# Patient Record
Sex: Female | Born: 1975 | Race: White | Hispanic: No | Marital: Married | Smoking: Current every day smoker
Health system: Southern US, Community
[De-identification: ages and names within clinical notes are randomized; demographics above are authoritative.]

## PROBLEM LIST (undated history)

## (undated) DIAGNOSIS — I8511 Secondary esophageal varices with bleeding: Secondary | ICD-10-CM

## (undated) DIAGNOSIS — D696 Thrombocytopenia, unspecified: Secondary | ICD-10-CM

## (undated) DIAGNOSIS — K7030 Alcoholic cirrhosis of liver without ascites: Secondary | ICD-10-CM

## (undated) DIAGNOSIS — I10 Essential (primary) hypertension: Secondary | ICD-10-CM

## (undated) DIAGNOSIS — F1020 Alcohol dependence, uncomplicated: Secondary | ICD-10-CM

## (undated) HISTORY — PX: OOPHORECTOMY: SHX86

## (undated) HISTORY — PX: OTHER SURGICAL HISTORY: SHX169

## (undated) HISTORY — PX: APPENDECTOMY: SHX54

---

## 2016-12-10 ENCOUNTER — Inpatient Hospital Stay (HOSPITAL_COMMUNITY): Payer: 59

## 2016-12-10 ENCOUNTER — Inpatient Hospital Stay (HOSPITAL_COMMUNITY): Payer: 59 | Admitting: Certified Registered Nurse Anesthetist

## 2016-12-10 ENCOUNTER — Encounter (HOSPITAL_COMMUNITY): Payer: Self-pay | Admitting: *Deleted

## 2016-12-10 ENCOUNTER — Inpatient Hospital Stay (HOSPITAL_COMMUNITY)
Admission: EM | Admit: 2016-12-10 | Discharge: 2016-12-14 | DRG: 432 | Disposition: A | Discharge status: Home / Self Care | Payer: 59 | Attending: Family Medicine | Admitting: Family Medicine

## 2016-12-10 ENCOUNTER — Encounter (HOSPITAL_COMMUNITY)
Admission: EM | Disposition: A | Discharge status: Home / Self Care | Payer: Self-pay | Source: Home / Self Care | Attending: Internal Medicine

## 2016-12-10 DIAGNOSIS — Z881 Allergy status to other antibiotic agents status: Secondary | ICD-10-CM | POA: Diagnosis not present

## 2016-12-10 DIAGNOSIS — D696 Thrombocytopenia, unspecified: Secondary | ICD-10-CM | POA: Insufficient documentation

## 2016-12-10 DIAGNOSIS — I8501 Esophageal varices with bleeding: Secondary | ICD-10-CM

## 2016-12-10 DIAGNOSIS — E875 Hyperkalemia: Secondary | ICD-10-CM | POA: Diagnosis present

## 2016-12-10 DIAGNOSIS — K766 Portal hypertension: Secondary | ICD-10-CM | POA: Diagnosis present

## 2016-12-10 DIAGNOSIS — D62 Acute posthemorrhagic anemia: Secondary | ICD-10-CM | POA: Diagnosis present

## 2016-12-10 DIAGNOSIS — I8500 Esophageal varices without bleeding: Secondary | ICD-10-CM | POA: Diagnosis not present

## 2016-12-10 DIAGNOSIS — D6959 Other secondary thrombocytopenia: Secondary | ICD-10-CM | POA: Diagnosis present

## 2016-12-10 DIAGNOSIS — F1020 Alcohol dependence, uncomplicated: Secondary | ICD-10-CM | POA: Diagnosis present

## 2016-12-10 DIAGNOSIS — K7030 Alcoholic cirrhosis of liver without ascites: Secondary | ICD-10-CM | POA: Diagnosis present

## 2016-12-10 DIAGNOSIS — K7460 Unspecified cirrhosis of liver: Secondary | ICD-10-CM

## 2016-12-10 DIAGNOSIS — D61818 Other pancytopenia: Secondary | ICD-10-CM | POA: Diagnosis present

## 2016-12-10 DIAGNOSIS — Z91013 Allergy to seafood: Secondary | ICD-10-CM | POA: Diagnosis not present

## 2016-12-10 DIAGNOSIS — I8511 Secondary esophageal varices with bleeding: Secondary | ICD-10-CM | POA: Diagnosis present

## 2016-12-10 DIAGNOSIS — K921 Melena: Secondary | ICD-10-CM | POA: Diagnosis present

## 2016-12-10 DIAGNOSIS — R109 Unspecified abdominal pain: Secondary | ICD-10-CM

## 2016-12-10 DIAGNOSIS — E860 Dehydration: Secondary | ICD-10-CM | POA: Diagnosis not present

## 2016-12-10 DIAGNOSIS — I959 Hypotension, unspecified: Secondary | ICD-10-CM | POA: Diagnosis present

## 2016-12-10 DIAGNOSIS — F17210 Nicotine dependence, cigarettes, uncomplicated: Secondary | ICD-10-CM | POA: Diagnosis present

## 2016-12-10 DIAGNOSIS — I10 Essential (primary) hypertension: Secondary | ICD-10-CM | POA: Diagnosis present

## 2016-12-10 DIAGNOSIS — K922 Gastrointestinal hemorrhage, unspecified: Secondary | ICD-10-CM | POA: Diagnosis not present

## 2016-12-10 HISTORY — DX: Essential (primary) hypertension: I10

## 2016-12-10 HISTORY — DX: Thrombocytopenia, unspecified: D69.6

## 2016-12-10 HISTORY — PX: ESOPHAGOGASTRODUODENOSCOPY (EGD) WITH PROPOFOL: SHX5813

## 2016-12-10 HISTORY — DX: Alcoholic cirrhosis of liver without ascites: K70.30

## 2016-12-10 HISTORY — DX: Alcohol dependence, uncomplicated: F10.20

## 2016-12-10 LAB — CBC WITH DIFFERENTIAL/PLATELET
BASOS ABS: 0.1 10*3/uL (ref 0.0–0.1)
Basophils Relative: 1 %
EOS PCT: 2 %
Eosinophils Absolute: 0.3 10*3/uL (ref 0.0–0.7)
HCT: 28.9 % — ABNORMAL LOW (ref 36.0–46.0)
HEMOGLOBIN: 9.6 g/dL — AB (ref 12.0–15.0)
LYMPHS ABS: 1.3 10*3/uL (ref 0.7–4.0)
LYMPHS PCT: 11 %
MCH: 35.3 pg — ABNORMAL HIGH (ref 26.0–34.0)
MCHC: 33.2 g/dL (ref 30.0–36.0)
MCV: 106.3 fL — AB (ref 78.0–100.0)
Monocytes Absolute: 1.3 10*3/uL — ABNORMAL HIGH (ref 0.1–1.0)
Monocytes Relative: 11 %
NEUTROS ABS: 8.8 10*3/uL — AB (ref 1.7–7.7)
NEUTROS PCT: 75 %
PLATELETS: 77 10*3/uL — AB (ref 150–400)
RBC: 2.72 MIL/uL — ABNORMAL LOW (ref 3.87–5.11)
RDW: 15.2 % (ref 11.5–15.5)
WBC: 11.7 10*3/uL — AB (ref 4.0–10.5)

## 2016-12-10 LAB — BASIC METABOLIC PANEL
ANION GAP: 3 — AB (ref 5–15)
BUN: 26 mg/dL — ABNORMAL HIGH (ref 6–20)
CO2: 24 mmol/L (ref 22–32)
Calcium: 8 mg/dL — ABNORMAL LOW (ref 8.9–10.3)
Chloride: 115 mmol/L — ABNORMAL HIGH (ref 101–111)
Creatinine, Ser: 0.62 mg/dL (ref 0.44–1.00)
Glucose, Bld: 133 mg/dL — ABNORMAL HIGH (ref 65–99)
Potassium: 5.1 mmol/L (ref 3.5–5.1)
Sodium: 142 mmol/L (ref 135–145)

## 2016-12-10 LAB — I-STAT CHEM 8, ED
BUN: 26 mg/dL — ABNORMAL HIGH (ref 6–20)
CHLORIDE: 110 mmol/L (ref 101–111)
Calcium, Ion: 1.2 mmol/L (ref 1.15–1.40)
Creatinine, Ser: 0.7 mg/dL (ref 0.44–1.00)
GLUCOSE: 136 mg/dL — AB (ref 65–99)
HCT: 28 % — ABNORMAL LOW (ref 36.0–46.0)
HEMOGLOBIN: 9.5 g/dL — AB (ref 12.0–15.0)
Potassium: 4.7 mmol/L (ref 3.5–5.1)
SODIUM: 140 mmol/L (ref 135–145)
TCO2: 25 mmol/L (ref 0–100)

## 2016-12-10 LAB — PROTIME-INR
INR: 1.37
PROTHROMBIN TIME: 16.9 s — AB (ref 11.4–15.2)

## 2016-12-10 LAB — HEMOGLOBIN AND HEMATOCRIT, BLOOD
HCT: 25.4 % — ABNORMAL LOW (ref 36.0–46.0)
HCT: 24 % — ABNORMAL LOW (ref 36.0–46.0)
HCT: 24.1 % — ABNORMAL LOW (ref 36.0–46.0)
HEMATOCRIT: 25.1 % — AB (ref 36.0–46.0)
HEMOGLOBIN: 8.4 g/dL — AB (ref 12.0–15.0)
HEMOGLOBIN: 8.2 g/dL — AB (ref 12.0–15.0)
Hemoglobin: 7.9 g/dL — ABNORMAL LOW (ref 12.0–15.0)
Hemoglobin: 7.9 g/dL — ABNORMAL LOW (ref 12.0–15.0)

## 2016-12-10 LAB — COMPREHENSIVE METABOLIC PANEL
ALK PHOS: 75 U/L (ref 38–126)
ALT: 61 U/L — AB (ref 14–54)
AST: 142 U/L — AB (ref 15–41)
Albumin: 2.9 g/dL — ABNORMAL LOW (ref 3.5–5.0)
Anion gap: 6 (ref 5–15)
BUN: 21 mg/dL — AB (ref 6–20)
CALCIUM: 8.6 mg/dL — AB (ref 8.9–10.3)
CHLORIDE: 109 mmol/L (ref 101–111)
CO2: 22 mmol/L (ref 22–32)
CREATININE: 0.75 mg/dL (ref 0.44–1.00)
GFR calc Af Amer: >60 mL/min (ref >60)
Glucose, Bld: 135 mg/dL — ABNORMAL HIGH (ref 65–99)
Potassium: 5.7 mmol/L — ABNORMAL HIGH (ref 3.5–5.1)
Sodium: 137 mmol/L (ref 135–145)
Total Bilirubin: 2.2 mg/dL — ABNORMAL HIGH (ref 0.3–1.2)
Total Protein: 6.6 g/dL (ref 6.5–8.1)

## 2016-12-10 LAB — TYPE AND SCREEN
ABO/RH(D): O POS
Antibody Screen: NEGATIVE

## 2016-12-10 LAB — I-STAT BETA HCG BLOOD, ED (MC, WL, AP ONLY)

## 2016-12-10 LAB — I-STAT CG4 LACTIC ACID, ED: LACTIC ACID, VENOUS: 0.62 mmol/L (ref 0.5–1.9)

## 2016-12-10 LAB — HCG, QUANTITATIVE, PREGNANCY: hCG, Beta Chain, Quant, S: <1 m[IU]/mL (ref <5)

## 2016-12-10 LAB — ABO/RH: ABO/RH(D): O POS

## 2016-12-10 LAB — TSH: TSH: 1.643 u[IU]/mL (ref 0.350–4.500)

## 2016-12-10 LAB — POC OCCULT BLOOD, ED: Fecal Occult Bld: POSITIVE — AB

## 2016-12-10 LAB — POTASSIUM: Potassium: 4.6 mmol/L (ref 3.5–5.1)

## 2016-12-10 LAB — MRSA PCR SCREENING: MRSA by PCR: NEGATIVE

## 2016-12-10 SURGERY — ESOPHAGOGASTRODUODENOSCOPY (EGD) WITH PROPOFOL
Anesthesia: Monitor Anesthesia Care

## 2016-12-10 MED ORDER — DEXAMETHASONE SODIUM PHOSPHATE 10 MG/ML IJ SOLN
INTRAMUSCULAR | Status: AC
  Filled 2016-12-10: qty 1

## 2016-12-10 MED ORDER — SODIUM POLYSTYRENE SULFONATE 15 GM/60ML PO SUSP
15.0000 g | Freq: Once | ORAL | Status: DC
  Filled 2016-12-10 (×2): qty 60

## 2016-12-10 MED ORDER — CHLORDIAZEPOXIDE HCL 5 MG PO CAPS
5.0000 mg | ORAL_CAPSULE | Freq: Three times a day (TID) | ORAL | Status: DC
  Administered 2016-12-10 – 2016-12-11 (×3): 5 mg via ORAL
  Filled 2016-12-10 (×3): qty 1

## 2016-12-10 MED ORDER — PROPOFOL 10 MG/ML IV BOLUS
INTRAVENOUS | Status: AC
  Filled 2016-12-10: qty 40

## 2016-12-10 MED ORDER — DEXAMETHASONE SODIUM PHOSPHATE 10 MG/ML IJ SOLN
INTRAMUSCULAR | Status: DC | PRN
  Administered 2016-12-10: 10 mg via INTRAVENOUS

## 2016-12-10 MED ORDER — CITALOPRAM HYDROBROMIDE 10 MG PO TABS: 10.0000 mg | ORAL_TABLET | Freq: Every day | ORAL | Status: DC

## 2016-12-10 MED ORDER — SODIUM CHLORIDE 0.9 % IV SOLN
8.0000 mg/h | INTRAVENOUS | Status: DC
  Administered 2016-12-10 – 2016-12-12 (×5): 8 mg/h via INTRAVENOUS
  Filled 2016-12-10 (×11): qty 80

## 2016-12-10 MED ORDER — OCTREOTIDE LOAD VIA INFUSION
50.0000 ug | Freq: Once | INTRAVENOUS | Status: AC
  Administered 2016-12-10: 50 ug via INTRAVENOUS
  Filled 2016-12-10: qty 25

## 2016-12-10 MED ORDER — FENTANYL CITRATE (PF) 100 MCG/2ML IJ SOLN
INTRAMUSCULAR | Status: DC | PRN
  Administered 2016-12-10: 100 ug via INTRAVENOUS

## 2016-12-10 MED ORDER — FOLIC ACID 1 MG PO TABS
1.0000 mg | ORAL_TABLET | Freq: Every day | ORAL | Status: DC
  Administered 2016-12-12 – 2016-12-14 (×3): 1 mg via ORAL
  Filled 2016-12-10 (×3): qty 1

## 2016-12-10 MED ORDER — SODIUM CHLORIDE 0.9 % IV SOLN
INTRAVENOUS | Status: DC | PRN
  Administered 2016-12-10: 13:00:00 via INTRAVENOUS

## 2016-12-10 MED ORDER — PROPOFOL 500 MG/50ML IV EMUL
INTRAVENOUS | Status: DC | PRN
  Administered 2016-12-10: 140 ug/kg/min via INTRAVENOUS

## 2016-12-10 MED ORDER — ADULT MULTIVITAMIN W/MINERALS CH
1.0000 | ORAL_TABLET | Freq: Every day | ORAL | Status: DC
  Administered 2016-12-12 – 2016-12-14 (×3): 1 via ORAL
  Filled 2016-12-10 (×3): qty 1

## 2016-12-10 MED ORDER — VITAMIN B-1 100 MG PO TABS
100.0000 mg | ORAL_TABLET | Freq: Every day | ORAL | Status: DC
  Administered 2016-12-12 – 2016-12-13 (×2): 100 mg via ORAL
  Filled 2016-12-10 (×3): qty 1

## 2016-12-10 MED ORDER — ALBUTEROL SULFATE (2.5 MG/3ML) 0.083% IN NEBU: 2.5000 mg | INHALATION_SOLUTION | RESPIRATORY_TRACT | Status: DC | PRN

## 2016-12-10 MED ORDER — LORAZEPAM 1 MG PO TABS
1.0000 mg | ORAL_TABLET | Freq: Four times a day (QID) | ORAL | Status: AC | PRN
  Administered 2016-12-12: 1 mg via ORAL
  Filled 2016-12-10: qty 1

## 2016-12-10 MED ORDER — SUCCINYLCHOLINE CHLORIDE 200 MG/10ML IV SOSY
PREFILLED_SYRINGE | INTRAVENOUS | Status: DC | PRN
  Administered 2016-12-10: 140 mg via INTRAVENOUS

## 2016-12-10 MED ORDER — PHENYLEPHRINE 40 MCG/ML (10ML) SYRINGE FOR IV PUSH (FOR BLOOD PRESSURE SUPPORT)
PREFILLED_SYRINGE | INTRAVENOUS | Status: DC | PRN
  Administered 2016-12-10: 40 ug via INTRAVENOUS

## 2016-12-10 MED ORDER — PROPOFOL 10 MG/ML IV BOLUS
INTRAVENOUS | Status: DC | PRN
  Administered 2016-12-10: 20 mg via INTRAVENOUS
  Administered 2016-12-10: 100 mg via INTRAVENOUS
  Administered 2016-12-10: 50 mg via INTRAVENOUS
  Administered 2016-12-10 (×2): 20 mg via INTRAVENOUS

## 2016-12-10 MED ORDER — FENTANYL CITRATE (PF) 100 MCG/2ML IJ SOLN
INTRAMUSCULAR | Status: AC
  Filled 2016-12-10: qty 2

## 2016-12-10 MED ORDER — POLYETHYLENE GLYCOL 3350 17 G PO PACK: 17.0000 g | PACK | Freq: Every day | ORAL | Status: DC | PRN

## 2016-12-10 MED ORDER — LACTATED RINGERS IV SOLN
INTRAVENOUS | Status: DC
Start: 2016-12-10 — End: 2016-12-14
  Administered 2016-12-12: 10 mL via INTRAVENOUS

## 2016-12-10 MED ORDER — DOCUSATE SODIUM 100 MG PO CAPS
200.0000 mg | ORAL_CAPSULE | Freq: Two times a day (BID) | ORAL | Status: DC
  Administered 2016-12-10 – 2016-12-14 (×7): 200 mg via ORAL
  Filled 2016-12-10 (×7): qty 2

## 2016-12-10 MED ORDER — SUCCINYLCHOLINE CHLORIDE 200 MG/10ML IV SOSY
PREFILLED_SYRINGE | INTRAVENOUS | Status: AC
  Filled 2016-12-10: qty 10

## 2016-12-10 MED ORDER — DEXTROSE 5 % IV SOLN
2.0000 g | INTRAVENOUS | Status: DC
  Administered 2016-12-10 – 2016-12-13 (×4): 2 g via INTRAVENOUS
  Filled 2016-12-10 (×4): qty 2

## 2016-12-10 MED ORDER — SODIUM CHLORIDE 0.9% FLUSH
3.0000 mL | Freq: Two times a day (BID) | INTRAVENOUS | Status: DC
  Administered 2016-12-10 – 2016-12-14 (×7): 3 mL via INTRAVENOUS

## 2016-12-10 MED ORDER — FENTANYL CITRATE (PF) 100 MCG/2ML IJ SOLN: 25.0000 ug | INTRAMUSCULAR | Status: DC | PRN

## 2016-12-10 MED ORDER — ONDANSETRON HCL 4 MG/2ML IJ SOLN
INTRAMUSCULAR | Status: AC
  Filled 2016-12-10: qty 2

## 2016-12-10 MED ORDER — ONDANSETRON HCL 4 MG/2ML IJ SOLN: 4.0000 mg | Freq: Four times a day (QID) | INTRAMUSCULAR | Status: DC | PRN

## 2016-12-10 MED ORDER — DEXTROSE-NACL 5-0.45 % IV SOLN
INTRAVENOUS | Status: AC
  Administered 2016-12-10 – 2016-12-11 (×2): via INTRAVENOUS

## 2016-12-10 MED ORDER — THIAMINE HCL 100 MG/ML IJ SOLN
100.0000 mg | Freq: Every day | INTRAMUSCULAR | Status: DC
  Administered 2016-12-11 – 2016-12-14 (×2): 100 mg via INTRAVENOUS
  Filled 2016-12-10 (×2): qty 2

## 2016-12-10 MED ORDER — LIDOCAINE 2% (20 MG/ML) 5 ML SYRINGE
INTRAMUSCULAR | Status: DC | PRN
  Administered 2016-12-10: 100 mg via INTRAVENOUS

## 2016-12-10 MED ORDER — PROPOFOL 10 MG/ML IV BOLUS
INTRAVENOUS | Status: AC
Start: 2016-12-10 — End: 2016-12-10
  Filled 2016-12-10: qty 20

## 2016-12-10 MED ORDER — SODIUM CHLORIDE 0.9 % IV BOLUS (SEPSIS)
2000.0000 mL | Freq: Once | INTRAVENOUS | Status: AC
  Administered 2016-12-10: 2000 mL via INTRAVENOUS

## 2016-12-10 MED ORDER — SODIUM CHLORIDE 0.9 % IV SOLN
50.0000 ug/h | INTRAVENOUS | Status: DC
  Administered 2016-12-10 – 2016-12-12 (×6): 50 ug/h via INTRAVENOUS
  Filled 2016-12-10 (×14): qty 1

## 2016-12-10 MED ORDER — ONDANSETRON HCL 4 MG/2ML IJ SOLN: 4.0000 mg | Freq: Once | INTRAMUSCULAR | Status: DC | PRN

## 2016-12-10 MED ORDER — PANTOPRAZOLE SODIUM 40 MG IV SOLR: 40.0000 mg | Freq: Two times a day (BID) | INTRAVENOUS | Status: DC

## 2016-12-10 MED ORDER — LORAZEPAM 2 MG/ML IJ SOLN
1.0000 mg | Freq: Four times a day (QID) | INTRAMUSCULAR | Status: DC | PRN
  Administered 2016-12-11: 1 mg via INTRAVENOUS
  Filled 2016-12-10 (×2): qty 1

## 2016-12-10 MED ORDER — MORPHINE SULFATE (PF) 2 MG/ML IV SOLN
2.0000 mg | Freq: Once | INTRAVENOUS | Status: AC
  Administered 2016-12-10: 2 mg via INTRAVENOUS
  Filled 2016-12-10: qty 1

## 2016-12-10 MED ORDER — LIDOCAINE 2% (20 MG/ML) 5 ML SYRINGE
INTRAMUSCULAR | Status: AC
  Filled 2016-12-10: qty 5

## 2016-12-10 MED ORDER — PANTOPRAZOLE SODIUM 40 MG IV SOLR
40.0000 mg | Freq: Once | INTRAVENOUS | Status: AC
  Administered 2016-12-10: 40 mg via INTRAVENOUS
  Filled 2016-12-10: qty 40

## 2016-12-10 MED ORDER — PANTOPRAZOLE SODIUM 40 MG IV SOLR
40.0000 mg | Freq: Once | INTRAVENOUS | Status: AC
  Administered 2016-12-10: 40 mg via INTRAVENOUS

## 2016-12-10 MED ORDER — MORPHINE SULFATE (PF) 2 MG/ML IV SOLN
2.0000 mg | INTRAVENOUS | Status: DC | PRN
  Administered 2016-12-10 – 2016-12-12 (×7): 2 mg via INTRAVENOUS
  Filled 2016-12-10 (×8): qty 1

## 2016-12-10 MED ORDER — RIFAXIMIN 550 MG PO TABS
550.0000 mg | ORAL_TABLET | Freq: Two times a day (BID) | ORAL | Status: DC
  Administered 2016-12-11 – 2016-12-14 (×7): 550 mg via ORAL
  Filled 2016-12-10 (×8): qty 1

## 2016-12-10 MED ORDER — CITALOPRAM HYDROBROMIDE 20 MG PO TABS
40.0000 mg | ORAL_TABLET | Freq: Every day | ORAL | Status: DC
  Administered 2016-12-11 – 2016-12-14 (×4): 40 mg via ORAL
  Filled 2016-12-10 (×4): qty 2

## 2016-12-10 SURGICAL SUPPLY — 14 items

## 2016-12-10 NOTE — ED Notes (Signed)
Patient's husband -Nida BoatmanBrad [phone]) called to inform us that patient has cirrhosis of the liver. Patient has been seen by a hepatologist- Dr Ardine Engiehl at [facility]. Patient has had an endoscopy, where they found she has medium sized varices, that were not banded.  Patient's Mother ,Martina SinnerCarol Peterson([phone]) has also called. Wanting to speak to patient, explained to mother that she just arrived by EMS and once she is available to speak on the phone, we will provide one to her. Mother provided information that her husband, patient's father) has recently passed away from cirrhosis.

## 2016-12-10 NOTE — Transfer of Care (Signed)
Immediate Anesthesia Transfer of Care Note  Patient: Sabrina CrumbleLauren P Arizmendi  Procedure(s) Performed: Procedure(s): ESOPHAGOGASTRODUODENOSCOPY (EGD) WITH PROPOFOL (N/A)  Patient Location: Endoscopy Unit  Anesthesia Type:General  Level of Consciousness: awake and alert   Airway & Oxygen Therapy: Patient Spontanous Breathing and Patient connected to face mask oxygen  Post-op Assessment: Report given to RN and Post -op Vital signs reviewed and stable  Post vital signs: Reviewed and stable  Last Vitals:  Vitals:   12/10/16 1315 12/10/16 1317  BP: (!) 100/47 (!) 100/47  Pulse: 80   Resp: 17 11  Temp:  37.1 C    Last Pain:  Vitals:   12/10/16 1317  TempSrc: Oral  PainSc:          Complications: No apparent anesthesia complications

## 2016-12-10 NOTE — ED Provider Notes (Signed)
[facility]-EMERGENCY DEPT Provider Note   CSN: [id number] Arrival date & time: 12/10/16  [id number]     History   Chief Complaint Chief Complaint  Patient presents with  . Melena    HPI Sabrina Maynard is a 41 y.o. female.  Patient has history of cirrhosis and varices. She was over at [facility] for rehabilitation for the last 5 days. She was put on lactulose to help with her ammonia level. Patient had a dark brown stool almost black today and was found to be hypotensive.   The history is provided by the patient. No language interpreter was used.  Weakness  Primary symptoms include no focal weakness. This is a new problem. The current episode started 12 to 24 hours ago. The problem has not changed since onset.There was no focality noted. There has been no fever. Fever duration: no fever. Pertinent negatives include no shortness of breath, no chest pain and no headaches. There were no medications administered prior to arrival. Associated medical issues do not include trauma.  Rectal Bleeding  Quality: dark brown. Amount:  Moderate Timing:  Rare Chronicity:  New Context: not anal fissures   Similar prior episodes: no   Relieved by:  Nothing Worsened by:  Nothing Ineffective treatments:  None tried Associated symptoms: no abdominal pain   Risk factors: no anticoagulant use     Past Medical History:  Diagnosis Date  . Alcoholic cirrhosis (HCC)   . Alcoholism (HCC)   . 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 Hypertension   . Thrombocytopenia [facility](HCC)     Patient Active Problem List   Diagnosis Date Noted  . Acute upper GI bleeding 12/10/2016  . 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 Alcoholic cirrhosis (HCC)   . Alcoholism (HCC)   . Hypertension   . Thrombocytopenia (HCC)     No past surgical history on file.  OB History    No data available       Home Medications    Prior to Admission medications   Medication Sig Start Date End Date  Taking? Authorizing Provider  citalopram (CELEXA) 40 MG tablet Take 40 mg by mouth every evening.   Yes [provider]  ibuprofen (ADVIL,MOTRIN) 200 MG tablet Take 400-600 mg by mouth every 6 (six) hours as needed for mild pain or moderate pain.   Yes [provider]  nadolol (CORGARD) 20 MG tablet Take 10 mg by mouth daily.   Yes [provider]    Family History No family history on file.  Social History Social History  Substance Use Topics  . Smoking status: Current Every Day Smoker  . Smokeless tobacco: Never Used  . Alcohol use Yes     Allergies   Crab [shellfish allergy] and Erythromycin   Review of Systems Review of Systems  Constitutional: Negative for appetite change and fatigue.  HENT: Negative for congestion, ear discharge and sinus pressure.   Eyes: Negative for discharge.  Respiratory: Negative for cough and shortness of breath.   Cardiovascular: Negative for chest pain.  Gastrointestinal: Positive for hematochezia. Negative for abdominal pain and diarrhea.       Dark brown stool  Genitourinary: Negative for frequency and hematuria.  Musculoskeletal: Negative for back pain.  Skin: Negative for rash.  Neurological: Positive for weakness. Negative for focal weakness, seizures and headaches.  Psychiatric/Behavioral: Negative for hallucinations.     Physical Exam Updated Vital Signs BP (!) 91/49 (BP Location: Left Arm)   Pulse 76  Temp 98.6 F (37 C) (Rectal)   Resp 16   LMP 12/02/2016 (Exact Date)   SpO2 99%   Physical Exam  Constitutional: She is oriented to person, place, and time. She appears well-developed.  HENT:  Head: Normocephalic.  Eyes: Conjunctivae and EOM are normal. No scleral icterus.  Neck: Neck supple. No thyromegaly present.  Cardiovascular: Normal rate and regular rhythm.  Exam reveals no gallop and no friction rub.   No murmur heard. Pulmonary/Chest: No stridor. She has no wheezes. She has no rales. She  exhibits no tenderness.  Abdominal: She exhibits no distension. There is no tenderness. There is no rebound.  Genitourinary:  Genitourinary Comments: Rectal exam showed dark brown stool that was heme positive.  Musculoskeletal: Normal range of motion. She exhibits no edema.  Lymphadenopathy:    She has no cervical adenopathy.  Neurological: She is oriented to person, place, and time. She exhibits normal muscle tone. Coordination normal.  Skin: No rash noted. No erythema.  Psychiatric: She has a normal mood and affect. Her behavior is normal.     ED Treatments / Results  Labs (all labs ordered are listed, but only abnormal results are displayed) Labs Reviewed  CBC WITH DIFFERENTIAL/PLATELET - Abnormal; Notable for the following:       Result Value   WBC 11.7 (*)    RBC 2.72 (*)    Hemoglobin 9.6 (*)    HCT 28.9 (*)    MCV 106.3 (*)    MCH 35.3 (*)    Platelets 77 (*)    Neutro Abs 8.8 (*)    Monocytes Absolute 1.3 (*)    All other components within normal limits  COMPREHENSIVE METABOLIC PANEL - Abnormal; Notable for the following:    Potassium 5.7 (*)    Glucose, Bld 135 (*)    BUN 21 (*)    Calcium 8.6 (*)    Albumin 2.9 (*)    AST 142 (*)    ALT 61 (*)    Total Bilirubin 2.2 (*)    All other components within normal limits  I-STAT CHEM 8, ED - Abnormal; Notable for the following:    BUN 26 (*)    Glucose, Bld 136 (*)    Hemoglobin 9.5 (*)    HCT 28.0 (*)    All other components within normal limits  POC OCCULT BLOOD, ED - Abnormal; Notable for the following:    Fecal Occult Bld POSITIVE (*)    All other components within normal limits  OCCULT BLOOD X 1 CARD TO LAB, STOOL  PROTIME-INR  HEMOGLOBIN AND HEMATOCRIT, BLOOD  HEMOGLOBIN AND HEMATOCRIT, BLOOD  HEMOGLOBIN AND HEMATOCRIT, BLOOD  HEMOGLOBIN AND HEMATOCRIT, BLOOD  BASIC METABOLIC PANEL  I-STAT CG4 LACTIC ACID, ED  I-STAT BETA HCG BLOOD, ED ([facility], [facility], [facility] ONLY)  TYPE AND SCREEN    EKG  EKG  Interpretation None       Radiology No results found.  Procedures Procedures (including critical care time)  Medications Ordered in ED Medications  octreotide (SANDOSTATIN) 2 mcg/mL load via infusion 50 mcg (not administered)    And  octreotide (SANDOSTATIN) 500 mcg in sodium chloride 0.9 % 250 mL (2 mcg/mL) infusion (not administered)  pantoprazole (PROTONIX) 80 mg in sodium chloride 0.9 % 250 mL (0.32 mg/mL) infusion (not administered)  pantoprazole (PROTONIX) injection 40 mg (not administered)  pantoprazole (PROTONIX) injection 40 mg (not administered)  dextrose 5 %-0.45 % sodium chloride infusion (not administered)  cefTRIAXone (ROCEPHIN) 2 g in dextrose 5 %  50 mL IVPB (not administered)  ondansetron (ZOFRAN) injection 4 mg (not administered)  albuterol (PROVENTIL) (2.5 MG/3ML) 0.083% nebulizer solution 2.5 mg (not administered)  sodium chloride 0.9 % bolus 2,000 mL (2,000 mLs Intravenous New Bag/Given 12/10/16 0734)  pantoprazole (PROTONIX) injection 40 mg (40 mg Intravenous Given 12/10/16 0749)     Initial Impression / Assessment and Plan / ED Course  I have reviewed the triage vital signs and the nursing notes.  Pertinent labs & imaging results that were available during my care of the patient were reviewed by me and considered in my medical decision making (see chart for details). CRITICAL CARE Performed by: Vineta Carone L Total critical care time: 40 minutes Critical care time was exclusive of separately billable procedures and treating other patients. Critical care was necessary to treat or prevent imminent or life-threatening deterioration. Critical care was time spent personally by me on the following activities: development of treatment plan with patient and/or surrogate as well as nursing, discussions with consultants, evaluation of patient's response to treatment, examination of patient, obtaining history from patient or surrogate, ordering and performing  treatments and interventions, ordering and review of laboratory studies, ordering and review of radiographic studies, pulse oximetry and re-evaluation of patient's condition.   patient's blood pressure responded after 2-1/2 L of fluids. GI was consult and she will be put on octreotide drip along with a PPI drip and be admitted to medicine. It is unclear whether she is having a GI bleed or not.     Final Clinical Impressions(s) / ED Diagnoses   Final diagnoses:  Melena    New Prescriptions New Prescriptions   No medications on file     Bethann Berkshire, MD 12/10/16 [phone]

## 2016-12-10 NOTE — Anesthesia Procedure Notes (Signed)
Procedure Name: Intubation Date/Time: 12/10/2016 1:52 PM Performed by: Leroy LibmanEARDON, Montanna Mcbain L Patient Re-evaluated:Patient Re-evaluated prior to inductionOxygen Delivery Method: Circle system utilized Preoxygenation: Pre-oxygenation with 100% oxygen Intubation Type: IV induction Ventilation: Mask ventilation without difficulty and Oral airway inserted - appropriate to patient size Laryngoscope Size: Glidescope Grade View: Grade II Tube type: Oral Tube size: 7.0 mm Number of attempts: 3 Airway Equipment and Method: Stylet Placement Confirmation: ETT inserted through vocal cords under direct vision,  positive ETCO2 and breath sounds checked- equal and bilateral Secured at: 22 cm Tube secured with: Tape Dental Injury: Teeth and Oropharynx as per pre-operative assessment  Comments: Swelling noted in posterior pharynx making visualization difficult with Miller 2 blade by both CRNA and MD.   Progressed to Glidescope.

## 2016-12-10 NOTE — Interval H&P Note (Signed)
History and Physical Interval Note:  12/10/2016 1:14 PM  Sabrina Maynard  has presented today for surgery, with the diagnosis of GI bleed /cirrhosis  The various methods of treatment have been discussed with the patient and family. After consideration of risks, benefits and other options for treatment, the patient has consented to  Procedure(s): ESOPHAGOGASTRODUODENOSCOPY (EGD) WITH PROPOFOL (N/A) as a surgical intervention .  The patient's history has been reviewed, patient examined, no change in status, stable for surgery.  I have reviewed the patient's chart and labs.  Questions were answered to the patient's satisfaction.     Sabrina ForthSteven Paul Zael Shuman

## 2016-12-10 NOTE — ED Triage Notes (Signed)
She comes to us from [facility]. She is from [place] and went to [facility] for alcoholism treatment. She reports several "dark" liquid stools this morning.

## 2016-12-10 NOTE — H&P (View-Only) (Signed)
Consultation  Referring Provider: ER MD Tamela Gammon Primary Care Physician:  No primary care provider on file. Primary Gastroenterologist:  Dr Roselyn Bering- [facility].  Reason for Consultation:  Melena  HPI: Sabrina Maynard is a 41 y.o. female  who presented to the emergency room early this morning after she had a very dark blackish bowel movement at about 5:45 AM. Patient is currently in alcohol rehabilitation at [facility] and was also noted to have hypotension. Patient states she lives in [place] and is followed by Dr. Roselyn Bering at [facility] hepatology with known diagnosis of cirrhosis over the past year. Patient is an alcoholic and has been actively drinking. She says her father died about 2 weeks ago and she started drinking heavily at that time. She was admitted to [facility] on Tuesday, 12/05/2016. Her last drink was on 12/04/2016. She says she has been doing well and did not go through any overt DTs. She relates that her ammonia level was elevated and she was started on lactulose and she had not required previously. She says this is causing some abdominal cramping. She felt fine until early this morning when she developed some vague nausea and mild abdominal cramping and then passed a large very dark brown/black bowel movement. She has not had any emesis. She denies any dizziness. No further bowel movements since arrival to the emergency room. Patient is known to have portal hypertension and has been on Corgard. She says she had an endoscopy she believes in February 2018 per Dr. deal with finding of 3 columns of varices. She has not had any previous GI bleeding. Her only other regular medication is citalopram. She takes occasional ibuprofen but not on a regular basis. No previous records available for review. Hemoglobin on arrival 9.5 hematocrit of 28 platelet count of 77, BUN 26 creatinine 0.7, albumin 2.9, AST 142, ALT 61 total bili 2.2. Pro time 16.9/INR of 1.37 Patient is married, lives  with her husband in [place]. She states that he is supposed to leave to go to [place] today for a work-related trip. Her mother lives several hours away but plans to come here.    Past Medical History:  Diagnosis Date  . Alcoholic cirrhosis (HCC)   . Alcoholism (HCC)   . 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 Hypertension   . Thrombocytopenia (HCC)     No past surgical history on file.  Prior to Admission medications   Medication Sig Start Date End Date Taking? Authorizing Provider  citalopram (CELEXA) 40 MG tablet Take 40 mg by mouth every evening.   Yes [provider]  ibuprofen (ADVIL,MOTRIN) 200 MG tablet Take 400-600 mg by mouth every 6 (six) hours as needed for mild pain or moderate pain.   Yes [provider]  nadolol (CORGARD) 20 MG tablet Take 10 mg by mouth daily.   Yes [provider]    Current Facility-Administered Medications  Medication Dose Route Frequency Provider Last Rate Last Dose  . albuterol (PROVENTIL) (2.5 MG/3ML) 0.083% nebulizer solution 2.5 mg  2.5 mg Nebulization Q4H PRN Leroy Sea, MD      . cefTRIAXone (ROCEPHIN) 2 g in dextrose 5 % 50 mL IVPB  2 g Intravenous Q24H Singh, Prashant K, MD      . dextrose 5 %-0.45 % sodium chloride infusion   Intravenous Continuous Susa Raring K, MD      . octreotide (SANDOSTATIN) 2 mcg/mL load via infusion 50 mcg  50 mcg Intravenous Once  Bethann Berkshire, MD       And  . octreotide (SANDOSTATIN) 500 mcg in sodium chloride 0.9 % 250 mL (2 mcg/mL) infusion  50 mcg/hr Intravenous Continuous Bethann Berkshire, MD      . ondansetron [facility]) injection 4 mg  4 mg Intravenous Q6H PRN Leroy Sea, MD      . pantoprazole (PROTONIX) 80 mg in sodium chloride 0.9 % 250 mL (0.32 mg/mL) infusion  8 mg/hr Intravenous Continuous Bethann Berkshire, MD      . Melene Muller ON 12/13/2016] pantoprazole (PROTONIX) injection 40 mg  40 mg Intravenous Q12H Bethann Berkshire, MD      . pantoprazole (PROTONIX)  injection 40 mg  40 mg Intravenous Once Bethann Berkshire, MD      . sodium polystyrene (KAYEXALATE) 15 GM/60ML suspension 15 g  15 g Oral Once Leroy Sea, MD       Current Outpatient Prescriptions  Medication Sig Dispense Refill  . citalopram (CELEXA) 40 MG tablet Take 40 mg by mouth every evening.    Marland Kitchen ibuprofen (ADVIL,MOTRIN) 200 MG tablet Take 400-600 mg by mouth every 6 (six) hours as needed for mild pain or moderate pain.    . nadolol (CORGARD) 20 MG tablet Take 10 mg by mouth daily.      Allergies as of 12/10/2016 - Review Complete 12/10/2016  Allergen Reaction Noted  . Crab [shellfish allergy] Nausea And Vomiting 12/10/2016  . Erythromycin  12/10/2016    No family history on file.  Social History   Social History  . Marital status: N/A    Spouse name: N/A  . Number of children: N/A  . Years of education: N/A   Occupational History  . Not on file.   Social History Main Topics  . Smoking status: Current Every Day Smoker  . Smokeless tobacco: Never Used  . Alcohol use Yes  . Drug use: Unknown  . Sexual activity: Not on file   Other Topics Concern  . Not on file   Social History Narrative  . No narrative on file    Review of Systems: Pertinent positive and negative review of systems were noted in the above HPI section.  All other review of systems was otherwise negative.  Physical Exam: Vital signs in last 24 hours: Temp:  [98.6 F (37 C)] 98.6 F (37 C) (05/27 0727) Pulse Rate:  [76] 76 (05/27 0727) Resp:  [16] 16 (05/27 0727) BP: (91)/(49) 91/49 (05/27 0727) SpO2:  [99 %] 99 % (05/27 0727)   General:   Alert,  Well-developed, well-nourished, WF,pleasant and cooperative in NAD Head:  Normocephalic and atraumatic. Eyes:  Sclera clear, no icterus.   Conjunctiva pink. Ears:  Normal auditory acuity. Nose:  No deformity, discharge,  or lesions. Mouth:  No deformity or lesions.   Neck:  Supple; no masses or thyromegaly.telangectasia around neck Lungs:   Clear throughout to auscultation.   No wheezes, crackles, or rhonchi. Heart:  Regular rate and rhythm; no murmurs, clicks, rubs,  or gallops. Abdomen:  Soft,nontender, BS active,protuberant  , spleen tip palp , no definite fluid wave Rectal:  Deferred -documented melena Msk:  Symmetrical without gross deformities. . Pulses:  Normal pulses noted. Extremities:  Without clubbing or edema. Neurologic:  Alert and  oriented x4;  grossly normal neurologically.No Asterixis Skin:  Intact without significant lesions or rashes.. Psych:  Alert and cooperative. Normal mood and affect.  Intake/Output from previous day: No intake/output data recorded. Intake/Output this shift: No intake/output data recorded.  Lab  Results:  Recent Labs  12/10/16 0742 12/10/16 0800  WBC  --  11.7*  HGB 9.5* 9.6*  HCT 28.0* 28.9*  PLT  --  77*   BMET  Recent Labs  12/10/16 0742 12/10/16 0800  NA 140 137  K 4.7 5.7*  CL 110 109  CO2  --  22  GLUCOSE 136* 135*  BUN 26* 21*  CREATININE 0.70 0.75  CALCIUM  --  8.6*   LFT  Recent Labs  12/10/16 0800  PROT 6.6  ALBUMIN 2.9*  AST 142*  ALT 61*  ALKPHOS 75  BILITOT 2.2*      IMPRESSION:   #711 41 year old white female alcoholic with decompensated cirrhosis, currently undergoing inpatient rehabilitation at [facility] over the past 6 days was brought to the emergency room the room this morning after passing a melanic stool.  Current MELD 13 She was hypotensive on arrival, not tachycardic. She has responded to volume. No active bleeding since arrival. Unclear whether she is having bleeding secondary to esophageal varices or perhaps portal gastropathy. #2 known esophageal varices, no prior history of GI bleeding-maintained on Corgard #3 anemia secondary to above #4 mild coagulopathy #5 thrombocytopenia #6 recent elevated ammonia level, no overt signs of encephalopathy, mentating very well   PLAN: #1 Patient is to be admitted to the stepdown  unit #2 she has been started on octreotide infusion and also Protonix infusion #3 serial hemoglobins every 4-6 hours and transfuse for hemoglobin less than 8 #4 Rocephin has been started IV #5 will plan for EGD with Dr. Adela LankArmbruster today. Procedure discussed in detail with patient including risks and benefits and she is agreeable to proceed #6 continue citalopram #7 Xifaxan was ordered, will discontinue as she has allergy to erythromycin, continue lactulose  Thank you we will follow with you. We will request her records from [facility], and plan to return to the care of Dr. Roselyn Beringeal on discharge.   Marelin Tat  12/10/2016, 9:51 AM

## 2016-12-10 NOTE — ED Notes (Signed)
She has been in, and remains in no distress. She has been comfortably napping. She arouses easily and is oriented x 4 with clear speech.

## 2016-12-10 NOTE — H&P (Signed)
TRH H&P   Patient Demographics:    Sabrina Maynard, is a 41 y.o. female  MRN: [id number]   DOB - 10-Sep-1975  Admit Date - 12/10/2016  Outpatient Primary MD for the patient is No primary care provider on file.  Outpatient Specialists: GI at [facility]    Patient coming from: [facility]  Chief Complaint  Patient presents with  . Melena      HPI:    Sabrina Maynard  is a 41 y.o. female, with H/O Alcoholic cirrhosis, history of esophageal varices, follows with GI physician at [facility], cirrhosis induced thrombocytopenia, hypertension, smoking, alcohol abuse. He was admitted to [facility] on Tuesday for alcoholic detox, comes in with one-day history of melena which happened early this morning associated with low blood pressures. She denies any nausea vomiting, no heartburn, no abdominal pain, denies throwing up any blood, no fever chills or headache, no focal weakness, no blood in urine. No frank red blood in stool.  In the ER her hemoglobin was found to be close to 9.5, she was hypotensive, fluid bolus was provided, case was discussed with [facility] physician on-call who requested patient be started on IV octreotide, IV PPI and be admitted to stepdown with GI to see. Note patient has no records here and for some reason she has no records under care everywhere. She is currently mentating well and review of systems otherwise negative.    Review of systems:    In addition to the HPI above,   No Fever-chills, No Headache, No changes with Vision or hearing, No problems swallowing food or Liquids, No Chest pain, Cough or Shortness of Breath, No Abdominal pain, No Nausea or Vommitting, Bowel movements are  regular, No Blood in stool or Urine, But +ve melena  No dysuria, No new skin rashes or bruises, No new joints pains-aches,  No new weakness, tingling, numbness in any extremity, No recent weight gain or loss, No polyuria, polydypsia or polyphagia, No significant Mental Stressors.  A full 10 point Review of Systems was done, except as stated above, all other Review of Systems were negative.   With Past History of the following :    Past Medical History:  Diagnosis Date  . Alcoholic cirrhosis (HCC)   . Alcoholism (HCC)   . 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  Hypertension   . Thrombocytopenia (HCC)       No past surgical history on file.    Social History:     Social History  Substance Use Topics  . Smoking status: Current Every Day Smoker  . Smokeless tobacco: Never Used  . Alcohol use Yes         Family History :   Family history negative for cirrhosis   Home Medications:   Prior to Admission medications   Medication Sig Start Date End Date Taking? Authorizing Provider  citalopram (CELEXA) 40 MG tablet Take 40 mg by mouth every evening.   Yes [provider]  ibuprofen (ADVIL,MOTRIN) 200 MG tablet Take 400-600 mg by mouth every 6 (six) hours as needed for mild pain or moderate pain.   Yes [provider]  nadolol (CORGARD) 20 MG tablet Take 10 mg by mouth daily.   Yes [provider]     Allergies:     Allergies  Allergen Reactions  . Crab [Shellfish Allergy] Nausea And Vomiting  . Erythromycin      Physical Exam:   Vitals  Blood pressure (!) 91/49, pulse 76, temperature 98.6 F (37 C), temperature source Rectal, resp. rate 16, last menstrual period 12/02/2016, SpO2 99 %.   1. General Middle-aged obese white female lying in bed in NAD,     2. Normal affect and insight, Not Suicidal or Homicidal, Awake Alert, Oriented X 3.  3. No F.N deficits, ALL C.Nerves Intact, Strength 5/5 all 4 extremities,  Sensation intact all 4 extremities, Plantars down going.  4. Ears and Eyes appear Normal, Conjunctivae clear, PERRLA. Moist Oral Mucosa.  5. Supple Neck, No JVD, No cervical lymphadenopathy appriciated, No Carotid Bruits.  6. Symmetrical Chest wall movement, Good air movement bilaterally, CTAB.  7. RRR, No Gallops, Rubs or Murmurs, No Parasternal Heave.  8. Positive Bowel Sounds, Abdomen Soft, No tenderness, No organomegaly appriciated,No rebound -guarding or rigidity. Heme positive stool  9.  No Cyanosis, Normal Skin Turgor, No Skin Rash or Bruise. Multiple spider nevi  10. Good muscle tone,  joints appear normal , no effusions, Normal ROM.  11. No Palpable Lymph Nodes in Neck or Axillae     Data Review:    CBC  Recent Labs Lab 12/10/16 0742 12/10/16 0800  WBC  --  11.7*  HGB 9.5* 9.6*  HCT 28.0* 28.9*  PLT  --  77*  MCV  --  106.3*  MCH  --  35.3*  MCHC  --  33.2  RDW  --  15.2  LYMPHSABS  --  1.3  MONOABS  --  1.3*  EOSABS  --  0.3  BASOSABS  --  0.1   ------------------------------------------------------------------------------------------------------------------  Chemistries   Recent Labs Lab 12/10/16 0742 12/10/16 0800  NA 140 137  K 4.7 5.7*  CL 110 109  CO2  --  22  GLUCOSE 136* 135*  BUN 26* 21*  CREATININE 0.70 0.75  CALCIUM  --  8.6*  AST  --  142*  ALT  --  61*  ALKPHOS  --  75  BILITOT  --  2.2*   ------------------------------------------------------------------------------------------------------------------ CrCl cannot be calculated (Unknown ideal weight.). ------------------------------------------------------------------------------------------------------------------ No results for input(s): TSH, T4TOTAL, T3FREE, THYROIDAB in the last 72 hours.  Invalid input(s): FREET3  Coagulation profile No results for input(s): INR, PROTIME in the last 168  hours. ------------------------------------------------------------------------------------------------------------------- No results for input(s): DDIMER in the last 72 hours. -------------------------------------------------------------------------------------------------------------------  Cardiac Enzymes No results for input(s): CKMB, TROPONINI,  MYOGLOBIN in the last 168 hours.  Invalid input(s): CK ------------------------------------------------------------------------------------------------------------------ No results found for: BNP   ---------------------------------------------------------------------------------------------------------------  Urinalysis No results found for: COLORURINE, APPEARANCEUR, LABSPEC, PHURINE, GLUCOSEU, HGBUR, BILIRUBINUR, KETONESUR, PROTEINUR, UROBILINOGEN, NITRITE, LEUKOCYTESUR  ----------------------------------------------------------------------------------------------------------------   Imaging Results:    No results found.     Assessment & Plan:      1. Melena with heme positive stool possibly acute upper GI bleed. Likely due to underlying liver cirrhosis with portal hypertension and esophageal varices. Surprisingly she has no upper GI symptoms. No previous lab work and file.  Currently H&H stable, she is hemodynamically hypotensive hence she will receive IV fluid bolus and maintenance, she will be placed on IV octreotide and PPI, monitor H&H, type and screen. [facility] has been called. Will keep nothing by mouth except medications and ice chips for now.    2. Alcohol abuse. Was currently in detox facility, last alcoholic drink 6 days ago, no signs of DTs, low-dose Librium and CIWA protocol.  3. Alcoholic cirrhosis with thrombocytopenia. No acute issues. Counseled to quit alcohol and cigarettes, post discharge follow with [facility] pathology and GI. For now hold her now to allow, we will place her on Xifaxan, Rocephin for SBP prophylaxis  and monitor.  4. Mild hyperkalemia. Could be lab error, will give gentle Kayexalate and repeat potassium later.   We will check baseline INR, HIV and hCG to rule out pregnancy.   DVT Prophylaxis SCDs    AM Labs Ordered, also please review Full Orders  Family Communication: Admission, patients condition and plan of care including tests being ordered have been discussed with the patient  who indicates understanding and agree with the plan and Code Status.  Code Status Full  Likely DC to  TBD  Condition GUARDED    Consults called: GI    Admission status: Inpt    Time spent in minutes : 35   Susa RaringPrashant Singh M.D on 12/10/2016 at 9:44 AM  Between 7am to 7pm - Pager - [phone] ( page via [facility], text pages only, please mention full 10 digit call back number).  After 7pm go to [email or web address] - password [email or web address]  [facility] - Office  [phone]

## 2016-12-10 NOTE — Consult Note (Signed)
  Consultation  Referring Provider: ER MD /DR Zammit Primary Care Physician:  No primary care provider on file. Primary Gastroenterologist:  Dr Deal- [facility].  Reason for Consultation:  Melena  HPI: Sabrina Maynard is a 40 y.o. female  who presented to the emergency room early this morning after she had a very dark blackish bowel movement at about 5:45 AM. Patient is currently in alcohol rehabilitation at [facility] and was also noted to have hypotension. Patient states she lives in [place] and is followed by Dr. Deal at [facility] with known diagnosis of cirrhosis over the past year. Patient is an alcoholic and has been actively drinking. She says her father died about 2 weeks ago and she started drinking heavily at that time. She was admitted to [facility] on Tuesday, 12/05/2016. Her last drink was on 12/04/2016. She says she has been doing well and did not go through any overt DTs. She relates that her ammonia level was elevated and she was started on lactulose and she had not required previously. She says this is causing some abdominal cramping. She felt fine until early this morning when she developed some vague nausea and mild abdominal cramping and then passed a large very dark brown/black bowel movement. She has not had any emesis. She denies any dizziness. No further bowel movements since arrival to the emergency room. Patient is known to have portal hypertension and has been on Corgard. She says she had an endoscopy she believes in February 2018 per Dr. deal with finding of 3 columns of varices. She has not had any previous GI bleeding. Her only other regular medication is citalopram. She takes occasional ibuprofen but not on a regular basis. No previous records available for review. Hemoglobin on arrival 9.5 hematocrit of 28 platelet count of 77, BUN 26 creatinine 0.7, albumin 2.9, AST 142, ALT 61 total bili 2.2. Pro time 16.9/INR of 1.37 Patient is married, lives  with her husband in [place]. She states that he is supposed to leave to go to [place] today for a work-related trip. Her mother lives several hours away but plans to come here.    Past Medical History:  Diagnosis Date  . Alcoholic cirrhosis (HCC)   . Alcoholism (HCC)   . 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 Hypertension   . Thrombocytopenia (HCC)     No past surgical history on file.  Prior to Admission medications   Medication Sig Start Date End Date Taking? Authorizing Provider  citalopram (CELEXA) 40 MG tablet Take 40 mg by mouth every evening.   Yes [provider]  ibuprofen (ADVIL,MOTRIN) 200 MG tablet Take 400-600 mg by mouth every 6 (six) hours as needed for mild pain or moderate pain.   Yes [provider]  nadolol (CORGARD) 20 MG tablet Take 10 mg by mouth daily.   Yes [provider]    Current Facility-Administered Medications  Medication Dose Route Frequency Provider Last Rate Last Dose  . albuterol (PROVENTIL) (2.5 MG/3ML) 0.083% nebulizer solution 2.5 mg  2.5 mg Nebulization Q4H PRN Singh, Prashant K, MD      . cefTRIAXone (ROCEPHIN) 2 g in dextrose 5 % 50 mL IVPB  2 g Intravenous Q24H Singh, Prashant K, MD      . dextrose 5 %-0.45 % sodium chloride infusion   Intravenous Continuous Singh, Prashant K, MD      . octreotide (SANDOSTATIN) 2 mcg/mL load via infusion 50 mcg  50 mcg Intravenous Once   Zammit, Joseph, MD       And  . octreotide (SANDOSTATIN) 500 mcg in sodium chloride 0.9 % 250 mL (2 mcg/mL) infusion  50 mcg/hr Intravenous Continuous Zammit, Joseph, MD      . ondansetron (ZOFRAN) injection 4 mg  4 mg Intravenous Q6H PRN Singh, Prashant K, MD      . pantoprazole (PROTONIX) 80 mg in sodium chloride 0.9 % 250 mL (0.32 mg/mL) infusion  8 mg/hr Intravenous Continuous Zammit, Joseph, MD      . [START ON 12/13/2016] pantoprazole (PROTONIX) injection 40 mg  40 mg Intravenous Q12H Zammit, Joseph, MD      . pantoprazole (PROTONIX)  injection 40 mg  40 mg Intravenous Once Zammit, Joseph, MD      . sodium polystyrene (KAYEXALATE) 15 GM/60ML suspension 15 g  15 g Oral Once Singh, Prashant K, MD       Current Outpatient Prescriptions  Medication Sig Dispense Refill  . citalopram (CELEXA) 40 MG tablet Take 40 mg by mouth every evening.    . ibuprofen (ADVIL,MOTRIN) 200 MG tablet Take 400-600 mg by mouth every 6 (six) hours as needed for mild pain or moderate pain.    . nadolol (CORGARD) 20 MG tablet Take 10 mg by mouth daily.      Allergies as of 12/10/2016 - Review Complete 12/10/2016  Allergen Reaction Noted  . Crab [shellfish allergy] Nausea And Vomiting 12/10/2016  . Erythromycin  12/10/2016    No family history on file.  Social History   Social History  . Marital status: N/A    Spouse name: N/A  . Number of children: N/A  . Years of education: N/A   Occupational History  . Not on file.   Social History Main Topics  . Smoking status: Current Every Day Smoker  . Smokeless tobacco: Never Used  . Alcohol use Yes  . Drug use: Unknown  . Sexual activity: Not on file   Other Topics Concern  . Not on file   Social History Narrative  . No narrative on file    Review of Systems: Pertinent positive and negative review of systems were noted in the above HPI section.  All other review of systems was otherwise negative.  Physical Exam: Vital signs in last 24 hours: Temp:  [98.6 F (37 C)] 98.6 F (37 C) (05/27 0727) Pulse Rate:  [76] 76 (05/27 0727) Resp:  [16] 16 (05/27 0727) BP: (91)/(49) 91/49 (05/27 0727) SpO2:  [99 %] 99 % (05/27 0727)   General:   Alert,  Well-developed, well-nourished, WF,pleasant and cooperative in NAD Head:  Normocephalic and atraumatic. Eyes:  Sclera clear, no icterus.   Conjunctiva pink. Ears:  Normal auditory acuity. Nose:  No deformity, discharge,  or lesions. Mouth:  No deformity or lesions.   Neck:  Supple; no masses or thyromegaly.telangectasia around neck Lungs:   Clear throughout to auscultation.   No wheezes, crackles, or rhonchi. Heart:  Regular rate and rhythm; no murmurs, clicks, rubs,  or gallops. Abdomen:  Soft,nontender, BS active,protuberant  , spleen tip palp , no definite fluid wave Rectal:  Deferred -documented melena Msk:  Symmetrical without gross deformities. . Pulses:  Normal pulses noted. Extremities:  Without clubbing or edema. Neurologic:  Alert and  oriented x4;  grossly normal neurologically.No Asterixis Skin:  Intact without significant lesions or rashes.. Psych:  Alert and cooperative. Normal mood and affect.  Intake/Output from previous day: No intake/output data recorded. Intake/Output this shift: No intake/output data recorded.  Lab   Results:  Recent Labs  12/10/16 0742 12/10/16 0800  WBC  --  11.7*  HGB 9.5* 9.6*  HCT 28.0* 28.9*  PLT  --  77*   BMET  Recent Labs  12/10/16 0742 12/10/16 0800  NA 140 137  K 4.7 5.7*  CL 110 109  CO2  --  22  GLUCOSE 136* 135*  BUN 26* 21*  CREATININE 0.70 0.75  CALCIUM  --  8.6*   LFT  Recent Labs  12/10/16 0800  PROT 6.6  ALBUMIN 2.9*  AST 142*  ALT 61*  ALKPHOS 75  BILITOT 2.2*      IMPRESSION:   #1 40-year-old white female alcoholic with decompensated cirrhosis, currently undergoing inpatient rehabilitation at [facility] over the past 6 days was brought to the emergency room the room this morning after passing a melanic stool.  Current MELD 13 She was hypotensive on arrival, not tachycardic. She has responded to volume. No active bleeding since arrival. Unclear whether she is having bleeding secondary to esophageal varices or perhaps portal gastropathy. #2 known esophageal varices, no prior history of GI bleeding-maintained on Corgard #3 anemia secondary to above #4 mild coagulopathy #5 thrombocytopenia #6 recent elevated ammonia level, no overt signs of encephalopathy, mentating very well   PLAN: #1 Patient is to be admitted to the stepdown  unit #2 she has been started on octreotide infusion and also Protonix infusion #3 serial hemoglobins every 4-6 hours and transfuse for hemoglobin less than 8 #4 Rocephin has been started IV #5 will plan for EGD with Dr. Armbruster today. Procedure discussed in detail with patient including risks and benefits and she is agreeable to proceed #6 continue citalopram #7 Xifaxan was ordered, will discontinue as she has allergy to erythromycin, continue lactulose  Thank you we will follow with you. We will request her records from [facility], and plan to return to the care of Dr. Deal on discharge.   Gaberiel Youngblood  12/10/2016, 9:51 AM   

## 2016-12-10 NOTE — Anesthesia Preprocedure Evaluation (Addendum)
Anesthesia Evaluation  Patient identified by MRN, date of birth, ID band Patient awake    Reviewed: Allergy & Precautions, NPO status , Patient's Chart, lab work & pertinent test results  Airway Mallampati: II  TM Distance: >3 FB Neck ROM: Full    Dental no notable dental hx.    Pulmonary neg pulmonary ROS, Current Smoker (1/2 ppd),    Pulmonary exam normal breath sounds clear to auscultation       Cardiovascular Exercise Tolerance: Good Pt. on home beta blockers Normal cardiovascular exam Rhythm:Regular Rate:Normal  ECG: SR, rate 77   Neuro/Psych PSYCHIATRIC DISORDERS negative neurological ROS     GI/Hepatic (+)     substance abuse  alcohol use, Portal HTN Esophageal varices   Endo/Other  negative endocrine ROS  Renal/GU negative Renal ROS  negative genitourinary   Musculoskeletal negative musculoskeletal ROS (+)   Abdominal   Peds negative pediatric ROS (+)  Hematology  (+) anemia , Thrombocytopenia    Anesthesia Other Findings hcg negative  Reproductive/Obstetrics negative OB ROS                            Anesthesia Physical Anesthesia Plan  ASA: III and emergent  Anesthesia Plan: MAC   Post-op Pain Management:    Induction: Intravenous  Airway Management Planned:   Additional Equipment:   Intra-op Plan:   Post-operative Plan:   Informed Consent: I have reviewed the patients History and Physical, chart, labs and discussed the procedure including the risks, benefits and alternatives for the proposed anesthesia with the patient or authorized representative who has indicated his/her understanding and acceptance.   Dental advisory given  Plan Discussed with: CRNA and Surgeon  Anesthesia Plan Comments:        Anesthesia Quick Evaluation

## 2016-12-10 NOTE — Progress Notes (Signed)
Discussed with Dr. Adela LankArmbruster [facility] Per Dr. Adela LankArmbruster patient is not to take PO medications at this time.

## 2016-12-10 NOTE — Op Note (Signed)
[facility] Patient Name: Sabrina Maynard Procedure Date: 12/10/2016 MRN: [id number] Attending MD: Carlota Raspberry. Marl Seago MD, MD Date of Birth: [birth year] CSN: [id number] Age: 41 Admit Type: Inpatient Procedure:                Upper GI endoscopy Indications:              Melena Providers:                Remo Lipps P. Havana Baldwin MD, MD, Kingsley Plan, RN,                            William Dalton, Technician Referring MD:              Medicines:                Monitored Anesthesia Care Complications:            No immediate complications. Estimated blood loss:                            Minimal. Estimated Blood Loss:     Estimated blood loss was minimal. Procedure:                Pre-Anesthesia Assessment:                           - Prior to the procedure, a History and Physical                            was performed, and patient medications and                            allergies were reviewed. The patient's tolerance of                            previous anesthesia was also reviewed. The risks                            and benefits of the procedure and the sedation                            options and risks were discussed with the patient.                            All questions were answered, and informed consent                            was obtained. Prior Anticoagulants: The patient has                            taken no previous anticoagulant or antiplatelet                            agents. ASA Grade Assessment: III - A patient with                            severe  systemic disease. After reviewing the risks                            and benefits, the patient was deemed in                            satisfactory condition to undergo the procedure.                           After obtaining informed consent, the endoscope was                            passed under direct vision. Throughout the                            procedure, the patient's blood  pressure, pulse, and                            oxygen saturations were monitored continuously. The                            EG-[id number] ([id number]) scope was introduced through the                            mouth, and advanced to the second part of duodenum.                            The upper GI endoscopy was accomplished without                            difficulty. The patient tolerated the procedure                            well. Scope In: Scope Out: Findings:      Esophagogastric landmarks were identified: the Z-line was found at 40       cm, the gastroesophageal junction was found at 40 cm and the upper       extent of the gastric folds was found at 40 cm from the incisors.      Varices were found in the middle third of the esophagus and in the lower       third of the esophagus. They were medium in size, with 2 areas of red       markings at the 9 o'clock position around 38cm from the incisors,       suspected to be the source of bleeding. Once the stomach was cleared and       no other obvious pathology noted to cause bleeding, six bands were       successfully placed in the distal and mid esophagus, 2 of them at the       site of red markings / suspected site of bleeding, with resulting in       deflation of varices. There was no active bleeding during, and at the       end, of the procedure.      The exam of the esophagus was otherwise normal. Red  blood was noted in       the esophagus.      A significant amount of red blood and blood clot was found in the entire       examined stomach, along with retained food in the distal gastric body,       making viewing of the gastric body initially very difficult. At the       beginning of the case, once this was noted, the patient was electively       intubated by anesthesia. Several minutes were taken to lavage the       stomach using suction and roth net to eventualy clear the stomach. No       active bleeding or pathology to  cause bleeding was noted in the stomach,       although views were fair in some areas due to some retained food.      The exam of the stomach was otherwise normal. No gastric varices were       appreciated.      Red blood was found in the duodenal bulb and in the second portion of       the duodenum without any pathology otherwise.      The exam of the duodenum was otherwise normal. Impression:               - Esophagogastric landmarks identified.                           - Medium sized esophageal varices, with 2 areas of                            red markings, suspected to be the source of                            bleeding. Banded x 6 with good result.                           - Red blood / clots / retained food in the stomach,                            patient electively intubated, several minutes taken                            to clear the stomach.                           - Normal duodenum Moderate Sedation:      No moderate sedation, case performed with MAC Recommendation:           - Return patient to ICU for ongoing care.                           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) prior to discharge                           - GI service will continue to follow Procedure Code(s):        --- Professional ---                           [phone], Esophagogastroduodenoscopy, flexible,                            transoral; with band ligation of esophageal/gastric                            varices Diagnosis Code(s):        --- Professional ---                            I85.00, Esophageal varices without bleeding                           K92.2, Gastrointestinal hemorrhage, unspecified                           K92.1, Melena (includes Hematochezia) CPT copyright 2016 American Medical Association. All rights reserved. The codes documented in this report are preliminary and upon coder review may  be revised to meet current compliance requirements. Remo Lipps P. Fermon Ureta MD, MD 12/10/2016 2:52:51 PM This report has been signed electronically. Number of Addenda: 0

## 2016-12-10 NOTE — Anesthesia Postprocedure Evaluation (Signed)
Anesthesia Post Note  Patient: Jaynie CrumbleLauren P Brossard  Procedure(s) Performed: Procedure(s) (LRB): ESOPHAGOGASTRODUODENOSCOPY (EGD) WITH PROPOFOL (N/A)  Patient location during evaluation: PACU Anesthesia Type: General Level of consciousness: awake and alert Pain management: pain level controlled Vital Signs Assessment: post-procedure vital signs reviewed and stable Respiratory status: spontaneous breathing, nonlabored ventilation, respiratory function stable and patient connected to nasal cannula oxygen Cardiovascular status: blood pressure returned to baseline and stable Postop Assessment: no signs of nausea or vomiting Anesthetic complications: no       Last Vitals:  Vitals:   12/10/16 1625 12/10/16 1640  BP: (!) 87/68 (!) 109/51  Pulse: 80 82  Resp: 13 (!) 9  Temp:      Last Pain:  Vitals:   12/10/16 1648  TempSrc:   PainSc: 9                  Marlita Keil P Jamarkus [place]

## 2016-12-11 ENCOUNTER — Inpatient Hospital Stay (HOSPITAL_COMMUNITY): Payer: 59

## 2016-12-11 ENCOUNTER — Encounter (HOSPITAL_COMMUNITY): Payer: Self-pay | Admitting: Gastroenterology

## 2016-12-11 LAB — HIV ANTIBODY (ROUTINE TESTING W REFLEX): HIV Screen 4th Generation wRfx: NONREACTIVE

## 2016-12-11 LAB — BASIC METABOLIC PANEL
Anion gap: 8 (ref 5–15)
BUN: 18 mg/dL (ref 6–20)
CO2: 22 mmol/L (ref 22–32)
CREATININE: 0.44 mg/dL (ref 0.44–1.00)
Calcium: 8.1 mg/dL — ABNORMAL LOW (ref 8.9–10.3)
Chloride: 109 mmol/L (ref 101–111)
GFR calc Af Amer: >60 mL/min (ref >60)
GLUCOSE: 148 mg/dL — AB (ref 65–99)
Potassium: 3.9 mmol/L (ref 3.5–5.1)
SODIUM: 139 mmol/L (ref 135–145)

## 2016-12-11 LAB — CBC
HCT: 23.6 % — ABNORMAL LOW (ref 36.0–46.0)
Hemoglobin: 8 g/dL — ABNORMAL LOW (ref 12.0–15.0)
MCH: 35.6 pg — ABNORMAL HIGH (ref 26.0–34.0)
MCHC: 33.9 g/dL (ref 30.0–36.0)
MCV: 104.9 fL — ABNORMAL HIGH (ref 78.0–100.0)
PLATELETS: 56 10*3/uL — AB (ref 150–400)
RBC: 2.25 MIL/uL — ABNORMAL LOW (ref 3.87–5.11)
RDW: 15.1 % (ref 11.5–15.5)
WBC: 7.3 10*3/uL (ref 4.0–10.5)

## 2016-12-11 LAB — HEMOGLOBIN AND HEMATOCRIT, BLOOD
HEMATOCRIT: 23.4 % — AB (ref 36.0–46.0)
Hemoglobin: 7.8 g/dL — ABNORMAL LOW (ref 12.0–15.0)

## 2016-12-11 NOTE — Progress Notes (Addendum)
[facility] PROGRESS NOTE    Progress Note  Sabrina CrumbleLauren P Carrera  ZOX:[id number] DOB: 05/07/1976 DOA: 12/10/2016 PCP: System, Pcp Not In    Brief Narrative:   Sabrina Maynard is an 41 y.o. female past medical history of alcoholic cirrhosis, esophageal varices follows with GI gastric urology at [facility], cirrhosis-induced pancytopenia was admitted for [facility] on Tuesday for detox they comes in with melanotic stools distorted the day of admission and hypotensive in the ER he was found to have a hemoglobin of 9.5 hours a station was started the pathology I was consulted.  Assessment/Plan:   Acute upper GI bleeding/Esophageal varices determined by endoscopy [facility](HCC): Likely due to underlying subcutaneous varices Started on IV PPI and octreotide. GI has been consulted to perform an endoscopy that showed medium sized esophageal varices with 2 areas of marking which is suspected as a source of bleeding banding was done. Hemoglobin today is 8.0. Allow clear liq diet, check liver ultrasound. She is empirically on IV Rocephin for his BP prophylaxis.  Alcohol abuse: Last  alcohol drinks was 6 days ago. We'll continue with low-dose Librium. Will titrate slowly  Alcoholic cirrhosis (HCC) with thrombocytopenia: Counseling was done about alcohol only to follow-up with [facility].  Mild hyperkalemia: Now resolved.  Essential Hypertension Blood pressure seems to be stable.  Thrombocytopenia: Likely due to alcohol abuse will continue to trend.  DVT prophylaxis: SCD's Family Communication:none Disposition Plan/Barrier to D/C: unable to detemrine Code Status:     Code Status Orders        Start     Ordered   12/10/16 1228  Full code  Continuous     12/10/16 1227    Code Status History    Date Active Date Inactive Code Status Order ID Comments User Context   This patient has a current code status but no historical code status.        IV Access:    Peripheral  IV   Procedures and diagnostic studies:   Dg Abd Acute W/chest  Result Date: 12/10/2016 CLINICAL DATA:  Severe abdominal pain after endoscopy today. History of cirrhosis. EXAM: DG ABDOMEN ACUTE W/ 1V CHEST COMPARISON:  None. FINDINGS: Cardiac silhouette is mildly enlarged, mediastinal silhouette is nonsuspicious. Pulmonary vascular congestion without pleural effusion or focal consolidation. No pneumothorax. Soft tissue planes included osseous structures are nonsuspicious. Bowel gas pattern is nondilated and nonobstructive. No intraperitoneal free air. No intra-abdominal mass effect or pathologic calcifications. Soft tissue planes and included osseous structures are normal. IMPRESSION: Mild cardiomegaly and pulmonary vascular congestion. Normal bowel gas pattern. Electronically Signed   By: Awilda Metroourtnay  Bloomer M.D.   On: 12/10/2016 23:54     Medical Consultants:    None.  Anti-Infectives:   None  Subjective:    Sabrina CrumbleLauren P Atayde she relates some chest pressure, she relates she would like something to eat.  Objective:    Vitals:   12/11/16 0005 12/11/16 0100 12/11/16 0332 12/11/16 0500  BP:  (!) 107/49    Pulse:  82    Resp:  16    Temp: 98.5 F (36.9 C)  98.5 F (36.9 C)   TempSrc: Oral  Oral   SpO2:  98%    Weight:    77 kg (169 lb 12.1 oz)  Height:        Intake/Output Summary (Last 24 hours) at 12/11/16 0805 Last data filed at 12/11/16 0800  Gross per 24 hour  Intake  3455.21 ml  Output             1300 ml  Net          2155.21 ml   Filed Weights   12/10/16 1317 12/11/16 0500  Weight: 79.8 kg (176 lb) 77 kg (169 lb 12.1 oz)    Exam: General exam: In no acute distress Respiratory system: Good air movement clear to auscultation. Cardiovascular system: Regular rate and rhythm with positive S1-S2 no murmurs rubs gallops Gastrointestinal system: Positive bowel sounds soft nontender nondistended Central nervous system: Awake alert and oriented  3 Extremities: No pedal edema Skin: No rashes Psychiatry: Judgment and intact appear normal.   Data Reviewed:    Labs: Basic Metabolic Panel:  Recent Labs Lab 12/10/16 0742 12/10/16 0800  12/10/16 1554 12/11/16 0335  NA 140 137  --  142 139  K 4.7 5.7*  < > 5.1 3.9  CL 110 109  --  115* 109  CO2  --  22  --  24 22  GLUCOSE 136* 135*  --  133* 148*  BUN 26* 21*  --  26* 18  CREATININE 0.70 0.75  --  0.62 0.44  CALCIUM  --  8.6*  --  8.0* 8.1*  < > = values in this interval not displayed. GFR Estimated Creatinine Clearance: 95.7 mL/min (by C-G formula based on SCr of 0.44 mg/dL). Liver Function Tests:  Recent Labs Lab 12/10/16 0800  AST 142*  ALT 61*  ALKPHOS 75  BILITOT 2.2*  PROT 6.6  ALBUMIN 2.9*   No results for input(s): LIPASE, AMYLASE in the last 168 hours. No results for input(s): AMMONIA in the last 168 hours. Coagulation profile  Recent Labs Lab 12/10/16 0800  INR 1.37    CBC:  Recent Labs Lab 12/10/16 0800 12/10/16 1008 12/10/16 1554 12/10/16 2206 12/11/16 0335  WBC 11.7*  --   --   --  7.3  NEUTROABS 8.8*  --   --   --   --   HGB 9.6* 8.4* 7.9*  7.9* 8.2* 8.0*  HCT 28.9* 25.4* 24.1*  24.0* 25.1* 23.6*  MCV 106.3*  --   --   --  104.9*  PLT 77*  --   --   --  56*   Cardiac Enzymes: No results for input(s): CKTOTAL, CKMB, CKMBINDEX, TROPONINI in the last 168 hours. BNP (last 3 results) No results for input(s): PROBNP in the last 8760 hours. CBG: No results for input(s): GLUCAP in the last 168 hours. D-Dimer: No results for input(s): DDIMER in the last 72 hours. Hgb A1c: No results for input(s): HGBA1C in the last 72 hours. Lipid Profile: No results for input(s): CHOL, HDL, LDLCALC, TRIG, CHOLHDL, LDLDIRECT in the last 72 hours. Thyroid function studies:  Recent Labs  12/10/16 1554  TSH 1.643   Anemia work up: No results for input(s): VITAMINB12, FOLATE, FERRITIN, TIBC, IRON, RETICCTPCT in the last 72 hours. Sepsis  Labs:  Recent Labs Lab 12/10/16 0800 12/10/16 0815 12/11/16 0335  WBC 11.7*  --  7.3  LATICACIDVEN  --  0.62  --    Microbiology Recent Results (from the past 240 hour(s))  MRSA PCR Screening     Status: None   Collection Time: 12/10/16 12:30 PM  Result Value Ref Range Status   MRSA by PCR NEGATIVE NEGATIVE Final    Comment:        The GeneXpert MRSA Assay (FDA approved for NASAL specimens only), is one component of a  comprehensive MRSA colonization surveillance program. It is not intended to diagnose MRSA infection nor to guide or monitor treatment for MRSA infections.      Medications:   . chlordiazePOXIDE  5 mg Oral TID  . citalopram  40 mg Oral Daily  . docusate sodium  200 mg Oral BID  . folic acid  1 mg Oral Daily  . multivitamin with minerals  1 tablet Oral Daily  . [START ON 12/13/2016] pantoprazole  40 mg Intravenous Q12H  . rifaximin  550 mg Oral BID  . sodium chloride flush  3 mL Intravenous Q12H  . sodium polystyrene  15 g Oral Once  . thiamine  100 mg Oral Daily   Or  . thiamine  100 mg Intravenous Daily   Continuous Infusions: . cefTRIAXone (ROCEPHIN)  IV Stopped (12/10/16 1118)  . dextrose 5 % and 0.45% NaCl 75 mL/hr at 12/11/16 0800  . lactated ringers    . octreotide  (SANDOSTATIN)    IV infusion 50 mcg/hr (12/11/16 0800)  . pantoprozole (PROTONIX) infusion 8 mg/hr (12/11/16 0800)    Time spent: 35 min   LOS: 1 day   Marinda Elk  [facility] Pager [phone]  *Please refer to [email or web address], password [email or web address] to get updated schedule on who will round on this patient, as hospitalists switch teams weekly. If 7PM-7AM, please contact night-coverage at [email or web address], password [email or web address] for any overnight needs.  12/11/2016, 8:05 AM

## 2016-12-11 NOTE — Care Management Note (Signed)
Case Management Note  Patient Details  Name: Jaynie CrumbleLauren P Dupin MRN: [id number] Date of Birth: 09-Mar-1976  Subjective/Objective:                  41 y.o. female, with H/O Alcoholic cirrhosis, history of esophageal varices, follows with GI physician at [facility], cirrhosis induced thrombocytopenia, hypertension, smoking, alcohol abuse. He was [facility] for alcoholic detox, comes in with one-day history of melena which happened early this morning associated with low blood pressures. She denies any nausea vomiting, no heartburn, no abdominal pain, denies throwing up any blood, no fever chills or headache, no focal weakness, no blood in urine. No frank red blood in stool.  In the ER her hemoglobin was found to be close to 9.5, she was hypotensive, fluid bolus was provided, case was discussed with [facility] physician on-call who requested patient be started on IV octreotide, IV PPI and be admitted to stepdown with GI to see. Note patient has no records here and for some reason she has no records under care everywhere. She is currently mentating well and review of systems otherwise negative.  Action/Plan: [facility]. Date:  Dec 11, 2016 Chart reviewed for concurrent status and case management needs. Will continue to follow patient progress. Discharge Planning: following for needs Expected discharge date: [id number] Marcelle SmilingRhonda Davis, BSN, [place], [place]   [phone] Expected Discharge Date:                  Expected Discharge Plan:  Home/Self Care  In-House Referral:     Discharge planning Services  CM Consult  Post Acute Care Choice:    Choice offered to:     DME Arranged:    DME Agency:     HH Arranged:    HH Agency:     Status of Service:  In process, will continue to follow  If discussed at Long Length of Stay Meetings, dates discussed:    Additional Comments:  Golda AcreDavis, Rhonda Lynn, RN 12/11/2016, 8:47 AM

## 2016-12-11 NOTE — Progress Notes (Signed)
Progress Note   Subjective  Patient has done well since the EGD yesterday. Passed a small amount of dark stool. No episodes of vomiting. She has some tightness in her chest since the banding. Hgb has remained stable, BUN downtrending.    Objective   Vital signs in last 24 hours: Temp:  [98.2 F (36.8 C)-98.7 F (37.1 C)] 98.2 F (36.8 C) (05/28 0800) Pulse Rate:  [76-90] 82 (05/28 0100) Resp:  [9-25] 16 (05/28 0100) BP: (84-120)/(31-68) 107/49 (05/28 0100) SpO2:  [93 %-100 %] 98 % (05/28 0100) Weight:  [169 lb 12.1 oz (77 kg)-176 lb (79.8 kg)] 169 lb 12.1 oz (77 kg) (05/28 0500)   General:    white female in NAD Heart:  Regular rate and rhythm; no murmurs Lungs: Respirations even and unlabored, lungs CTA bilaterally Abdomen:  Soft, nontender, protuberant.  Extremities:  Without edema. Neurologic:  Alert and oriented,  grossly normal neurologically. Psych:  Cooperative. Normal mood and affect.  Intake/Output from previous day: 05/27 0701 - 05/28 0700 In: 3330.2 [I.V.:3280.2; IV Piggyback:50] Out: 1300 [Urine:1300] Intake/Output this shift: Total I/O In: 125 [I.V.:125] Out: -   Lab Results:  Recent Labs  12/10/16 0800  12/10/16 1554 12/10/16 2206 12/11/16 0335  WBC 11.7*  --   --   --  7.3  HGB 9.6*  < > 7.9*  7.9* 8.2* 8.0*  HCT 28.9*  < > 24.1*  24.0* 25.1* 23.6*  PLT 77*  --   --   --  56*  < > = values in this interval not displayed. BMET  Recent Labs  12/10/16 0800 12/10/16 1008 12/10/16 1554 12/11/16 0335  NA 137  --  142 139  K 5.7* 4.6 5.1 3.9  CL 109  --  115* 109  CO2 22  --  24 22  GLUCOSE 135*  --  133* 148*  BUN 21*  --  26* 18  CREATININE 0.75  --  0.62 0.44  CALCIUM 8.6*  --  8.0* 8.1*   LFT  Recent Labs  12/10/16 0800  PROT 6.6  ALBUMIN 2.9*  AST 142*  ALT 61*  ALKPHOS 75  BILITOT 2.2*   PT/INR  Recent Labs  12/10/16 0800  LABPROT 16.9*  INR 1.37    Studies/Results: Dg Abd Acute W/chest  Result Date:  12/10/2016 CLINICAL DATA:  Severe abdominal pain after endoscopy today. History of cirrhosis. EXAM: DG ABDOMEN ACUTE W/ 1V CHEST COMPARISON:  None. FINDINGS: Cardiac silhouette is mildly enlarged, mediastinal silhouette is nonsuspicious. Pulmonary vascular congestion without pleural effusion or focal consolidation. No pneumothorax. Soft tissue planes included osseous structures are nonsuspicious. Bowel gas pattern is nondilated and nonobstructive. No intraperitoneal free air. No intra-abdominal mass effect or pathologic calcifications. Soft tissue planes and included osseous structures are normal. IMPRESSION: Mild cardiomegaly and pulmonary vascular congestion. Normal bowel gas pattern. Electronically Signed   By: Awilda Metroourtnay  Bloomer M.D.   On: 12/10/2016 23:54       Assessment / Plan:   41 y/o female with a history of alcoholic cirrhosis, who presented with a significant upper GI bleed yesterday. See endoscopy note for details of her report yesterday - overall suspect she had bleeding from esophageal varices. 6 bands applied with good result. No overt bleeding since the procedure. Hgb stable, BUN downtrending.   I was able [facility] - she has had an extensive evaluation with labs and a liver biopsy for her workup, leading diagnosis is  alcoholic cirrhosis. Last Hgb was mid 11s in January, last had HCC screening in January with MRI which was somewhat limited by motion artifact.   I spoke with the patient about the plan at this time, and called the patient's mother - we spoke at length about her course and options moving forward. Her father died from cirrhosis 3 weeks ago from similar complications, causing a lot of stress in the family. I answered their questions. They are asking if her Hepatologist from [facility] can be consulted on her case.   Recommend the following at this time: - clear liquid diet today, advance slowly tomorrow as tolerated - continue IV octreotide at  least 72 hours - continue IV protonix - continue ceftriaxone - continue Rifaximin for now (although I think lactulose previously given to her for elevated ammonia levels, not sure if she clinically ever had encephalopathy) - given her Child class B cirrhosis with suspected variceal bleeding, she may benefit from early TIPS (within 72 hours of bleeding). I have discussed this with the patient and family. Will obtain US liver with doppler and will discuss with IR to assess her candidacy. Will need to reach out to the patient's primary Hepatologist, Dr. Ardine Eng, per patient's request for their opinion on this issue, although not sure if we will be able to get her opinion today given the holiday.   Dr. Myrtie Neither is assuming the GI service tomorrow. Please call me with any further questions today.  Ileene Patrick, MD [facility] Pager [phone]

## 2016-12-11 NOTE — Progress Notes (Signed)
GI UPDATE:  I was able to get ahold of [facility]. Roselyn [facility]al [facility] and discussed this case with her, per the patient and family's request. Specifically we discussed whether or not to proceed with TIPS. Her thought is that the patient's recent alcohol intake likely increased her portal pressures and precipitated bleeding. Hopefully with abstinence and recovery it will minimize her risk of rebleeding. Following our discussion of risks / benefits of TIPS, ultimately she recommended against TIPS at this time given it's the patient's first variceal bleed. Hopefully the patient continues to recover nicely from the banding and with IV octreotide. We will proceed with further banding as needed every 2 weeks or so as outpatient until varices are completely eradicated. The patient will get this done wherever is closest to where she is living - if [facility] is able to [facility] [place], she will have it done with us.   I will update Dr. Myrtie Neitheranis as he is taking over this patient's GI care tomorrow.  I spoke with the patient and her husband about this issue, they agreed.   Ileene PatrickSteven Gentle Hoge, MD [facility] Pager [phone]

## 2016-12-12 DIAGNOSIS — I8511 Secondary esophageal varices with bleeding: Secondary | ICD-10-CM

## 2016-12-12 DIAGNOSIS — D62 Acute posthemorrhagic anemia: Secondary | ICD-10-CM

## 2016-12-12 LAB — CBC WITH DIFFERENTIAL/PLATELET
Basophils Absolute: 0.1 10*3/uL (ref 0.0–0.1)
Basophils Relative: 1 %
EOS ABS: 0.1 10*3/uL (ref 0.0–0.7)
EOS PCT: 1 %
HCT: 23.2 % — ABNORMAL LOW (ref 36.0–46.0)
HEMOGLOBIN: 7.7 g/dL — AB (ref 12.0–15.0)
LYMPHS ABS: 1.4 10*3/uL (ref 0.7–4.0)
LYMPHS PCT: 20 %
MCH: 34.8 pg — AB (ref 26.0–34.0)
MCHC: 33.2 g/dL (ref 30.0–36.0)
MCV: 105 fL — ABNORMAL HIGH (ref 78.0–100.0)
MONOS PCT: 11 %
Monocytes Absolute: 0.8 10*3/uL (ref 0.1–1.0)
NEUTROS PCT: 67 %
Neutro Abs: 4.9 10*3/uL (ref 1.7–7.7)
Platelets: 64 10*3/uL — ABNORMAL LOW (ref 150–400)
RBC: 2.21 MIL/uL — ABNORMAL LOW (ref 3.87–5.11)
RDW: 15.4 % (ref 11.5–15.5)
WBC: 7.2 10*3/uL (ref 4.0–10.5)

## 2016-12-12 LAB — COMPREHENSIVE METABOLIC PANEL
ALT: 64 U/L — ABNORMAL HIGH (ref 14–54)
AST: 99 U/L — ABNORMAL HIGH (ref 15–41)
Albumin: 3.1 g/dL — ABNORMAL LOW (ref 3.5–5.0)
Alkaline Phosphatase: 49 U/L (ref 38–126)
Anion gap: 4 — ABNORMAL LOW (ref 5–15)
BUN: 13 mg/dL (ref 6–20)
CO2: 27 mmol/L (ref 22–32)
CREATININE: 0.41 mg/dL — AB (ref 0.44–1.00)
Calcium: 8.4 mg/dL — ABNORMAL LOW (ref 8.9–10.3)
Chloride: 109 mmol/L (ref 101–111)
Glucose, Bld: 100 mg/dL — ABNORMAL HIGH (ref 65–99)
Potassium: 3.5 mmol/L (ref 3.5–5.1)
SODIUM: 140 mmol/L (ref 135–145)
TOTAL PROTEIN: 6.9 g/dL (ref 6.5–8.1)
Total Bilirubin: 1.3 mg/dL — ABNORMAL HIGH (ref 0.3–1.2)

## 2016-12-12 MED ORDER — PANTOPRAZOLE SODIUM 40 MG PO TBEC
40.0000 mg | DELAYED_RELEASE_TABLET | Freq: Every day | ORAL | Status: DC
  Administered 2016-12-13 – 2016-12-14 (×2): 40 mg via ORAL
  Filled 2016-12-12 (×2): qty 1

## 2016-12-12 MED ORDER — CHLORDIAZEPOXIDE HCL 5 MG PO CAPS
5.0000 mg | ORAL_CAPSULE | Freq: Every day | ORAL | Status: AC
  Administered 2016-12-12: 5 mg via ORAL
  Filled 2016-12-12: qty 1

## 2016-12-12 NOTE — Progress Notes (Signed)
Progress Note   Subjective  Chief Complaint: alcoholic cirrhosis, esophageal varices  Patient doing well this morning. She reports only minor muscle aches and pains which she believes are from sitting/laying so long. She denies any melena or "much stool at all". She did well with clear liquids yesterday and denies any questions today, overall she is feeling "stronger day by day".    Objective   Vital signs in last 24 hours: Temp:  [98.2 F (36.8 C)-98.7 F (37.1 C)] 98.7 F (37.1 C) (05/29 0800) Pulse Rate:  [70-81] 81 (05/29 1000) Resp:  [9-30] 21 (05/29 1000) BP: (88-116)/(28-68) 112/64 (05/29 1000) SpO2:  [91 %-99 %] 92 % (05/29 1000) Last BM Date: 12/10/16 General: Caucasian female in NAD Heart:  Regular rate and rhythm; no murmurs Lungs: Respirations even and unlabored, lungs CTA bilaterally Abdomen:  Soft, mild epigastric ttp and nondistended. Normal bowel sounds. Extremities:  Without edema. Neurologic:  Alert and oriented,  grossly normal neurologically. Psych:  Cooperative. Normal mood and affect.  Intake/Output from previous day: 05/28 0701 - 05/29 0700 In: 1698 [P.O.:360; I.V.:1338] Out: 1300 [Urine:1300] Intake/Output this shift: Total I/O In: -  Out: 300 [Urine:300]  Lab Results:  Recent Labs  12/10/16 0800  12/11/16 0335 12/11/16 1258 12/12/16 0327  WBC 11.7*  --  7.3  --  7.2  HGB 9.6*  < > 8.0* 7.8* 7.7*  HCT 28.9*  < > 23.6* 23.4* 23.2*  PLT 77*  --  56*  --  64*  < > = values in this interval not displayed. BMET  Recent Labs  12/10/16 1554 12/11/16 0335 12/12/16 0327  NA 142 139 140  K 5.1 3.9 3.5  CL 115* 109 109  CO2 24 22 27   GLUCOSE 133* 148* 100*  BUN 26* 18 13  CREATININE 0.62 0.44 0.41*  CALCIUM 8.0* 8.1* 8.4*   LFT  Recent Labs  12/12/16 0327  PROT 6.9  ALBUMIN 3.1*  AST 99*  ALT 64*  ALKPHOS 49  BILITOT 1.3*   PT/INR  Recent Labs  12/10/16 0800  LABPROT 16.9*  INR 1.37    Studies/Results: [place]  Abdomen Complete  Result Date: 12/11/2016 CLINICAL DATA:  History of alcoholic cirrhosis with recent upper GI bleed secondary to esophageal varices. EXAM: 1. ABDOMINAL ULTRASOUND 2. DUPLEX ULTRASOUND OF LIVER TECHNIQUE: Standard protocol abdominal ultrasound was performed followed by color and duplex Doppler ultrasound to evaluate the hepatic in-flow and out-flow vessels. COMPARISON:  None. FINDINGS: Gallbladder: There is nonspecific gallbladder wall thickening and pericholecystic fluid (representative images 9 and 11). No echogenic gallstones or biliary sludge. Negative sonographic Murphy's sign. Common bile duct: Diameter: Normal in size measuring 3.1 mm in diameter Liver: There is increased coarsened echogenicity of the hepatic parenchyma with mild nodularity of the hepatic contour. No discrete hepatic lesions. No intrahepatic bili duct dilatation. No ascites. IVC: No abnormality visualized. Pancreas: Visualized portion unremarkable. Spleen: Borderline enlarged measuring 12 cm 12.3 x 17.0 x 5.4 cm with calculated volume of 567.5 cm cubed Right Kidney: Normal cortical thickness, echogenicity and size, measuring 13.5 cm in length. No focal renal lesions. No echogenic renal stones. No urinary obstruction. Left Kidney: Normal cortical thickness, echogenicity and size, measuring 30.5 cm in length. No focal renal lesions. No echogenic renal stones. No urinary obstruction. Abdominal aorta: No aneurysm visualized. _________________________________________________________ Portal Vein Velocities Main:  26.2 cm/sec Right:  25.5 cm/sec Left:  20.3 cm/sec Hepatic Vein Velocities Right:  67.6 cm/sec Middle:  73.2 cm/sec Left:  36.1  cm/sec Hepatic Artery Velocity:  135.2 cm/sec Splenic Vein Velocity:  22 cm/sec Varices: None visualized Ascites: None visualized IMPRESSION: 1. Nonspecific gallbladder wall thickening and pericholecystic fluid without echogenic stones or biliary sludge. If clinical concern persists for acute  acalculous cholecystitis, further evaluation with nuclear medicine HIDA scan could be performed as indicated. 2. Increased coarsened echogenicity of the hepatic parenchyma with associated mild nodularity hepatic contour, findings suggestive of cirrhosis. 3. Splenomegaly, likely indicative of portal venous hypertension. No ascites. 4. Widely patent hepatic vascular system. Electronically Signed   By: Simonne Come M.D.   On: 12/11/2016 13:35   [place] Art/ven Flow Abd Pelv Doppler  Result Date: 12/11/2016 CLINICAL DATA:  History of alcoholic cirrhosis with recent upper GI bleed secondary to esophageal varices. EXAM: 1. ABDOMINAL ULTRASOUND 2. DUPLEX ULTRASOUND OF LIVER TECHNIQUE: Standard protocol abdominal ultrasound was performed followed by color and duplex Doppler ultrasound to evaluate the hepatic in-flow and out-flow vessels. COMPARISON:  None. FINDINGS: Gallbladder: There is nonspecific gallbladder wall thickening and pericholecystic fluid (representative images 9 and 11). No echogenic gallstones or biliary sludge. Negative sonographic Murphy's sign. Common bile duct: Diameter: Normal in size measuring 3.1 mm in diameter Liver: There is increased coarsened echogenicity of the hepatic parenchyma with mild nodularity of the hepatic contour. No discrete hepatic lesions. No intrahepatic bili duct dilatation. No ascites. IVC: No abnormality visualized. Pancreas: Visualized portion unremarkable. Spleen: Borderline enlarged measuring 12 cm 12.3 x 17.0 x 5.4 cm with calculated volume of 567.5 cm cubed Right Kidney: Normal cortical thickness, echogenicity and size, measuring 13.5 cm in length. No focal renal lesions. No echogenic renal stones. No urinary obstruction. Left Kidney: Normal cortical thickness, echogenicity and size, measuring 30.5 cm in length. No focal renal lesions. No echogenic renal stones. No urinary obstruction. Abdominal aorta: No aneurysm visualized.  _________________________________________________________ Portal Vein Velocities Main:  26.2 cm/sec Right:  25.5 cm/sec Left:  20.3 cm/sec Hepatic Vein Velocities Right:  67.6 cm/sec Middle:  73.2 cm/sec Left:  36.1 cm/sec Hepatic Artery Velocity:  135.2 cm/sec Splenic Vein Velocity:  22 cm/sec Varices: None visualized Ascites: None visualized IMPRESSION: 1. Nonspecific gallbladder wall thickening and pericholecystic fluid without echogenic stones or biliary sludge. If clinical concern persists for acute acalculous cholecystitis, further evaluation with nuclear medicine HIDA scan could be performed as indicated. 2. Increased coarsened echogenicity of the hepatic parenchyma with associated mild nodularity hepatic contour, findings suggestive of cirrhosis. 3. Splenomegaly, likely indicative of portal venous hypertension. No ascites. 4. Widely patent hepatic vascular system. Electronically Signed   By: Simonne Come M.D.   On: 12/11/2016 13:35   Dg Abd Acute W/chest  Result Date: 12/10/2016 CLINICAL DATA:  Severe abdominal pain after endoscopy today. History of cirrhosis. EXAM: DG ABDOMEN ACUTE W/ 1V CHEST COMPARISON:  None. FINDINGS: Cardiac silhouette is mildly enlarged, mediastinal silhouette is nonsuspicious. Pulmonary vascular congestion without pleural effusion or focal consolidation. No pneumothorax. Soft tissue planes included osseous structures are nonsuspicious. Bowel gas pattern is nondilated and nonobstructive. No intraperitoneal free air. No intra-abdominal mass effect or pathologic calcifications. Soft tissue planes and included osseous structures are normal. IMPRESSION: Mild cardiomegaly and pulmonary vascular congestion. Normal bowel gas pattern. Electronically Signed   By: Awilda Metro M.D.   On: 12/10/2016 23:54       Assessment / Plan:   Assessment: 1. Upper GI Bleed: s/p banding 12/10/16-6 bands, no overt bleeding since, small decrease in hgb over 24 hrs 8.0-->7.8-->7.7 2. Alcoholic  Cirrhosis: regularly follows with [facility]-per Dr. Lanetta Inch  discussion, no TIPS at this time-encourage etoh abstinence 3. Thrombocytopenia  Plan: 1. Continue to monitor hgb 2. IV Octreotide for total of 72 hours then taper (this may be later today-please await Dr. Irving Burton recs) 3. Continue IV abx 4. Continue IV Pantoprazole 5. Will titrate diet to full liquids today 6. Please await any further recs from Dr. Myrtie Neither later today  Thank you for your kind consultation, we will continue to follow.   LOS: 2 days   Unk Lightning  12/12/2016, 11:21 AM  Pager # [phone]   I have discussed the case with the PA, and that is the plan I formulated. I personally interviewed and examined the patient.  The variceal bleeding has stopped, fortunately her renal function remains stable and she shows no signs of hepatic encephalopathy on current therapy. She and her family report that she had not previously hepatic encephalopathy, but she was given lactulose while at [facility] for unclear reasons.  If she continues to do well by tomorrow morning, we will stop octreotide and put her back on nadolol 20 mg once daily. We will confirm her home dose with her. If she was on 40 mg a day, and if her blood pressure and pulse seemed to tolerate, we will resume that dose.  When we feel she is stable for medical discharge, I cannot see why she could not act to inpatient alcohol rehabilitation. It sounds like her hepatologist at [facility] understandably feel strongly that Lyman Bishop complete a formal alcohol rehabilitation program so she could be a candidate for liver transplantation consideration if that should ever be necessary.   I will change IV Protonix to oral today.  If no evidence of rebleeding by tomorrow morning, we will start her on a soft diet.   Charlie Pitter III Pager [phone]  Mon-Fri 8a-5p [phone] after 5p, weekends, holidays

## 2016-12-12 NOTE — Progress Notes (Signed)
[facility] PROGRESS NOTE    Progress Note  Sabrina Maynard  ZOX:[id number] DOB: 1976-01-18 DOA: 12/10/2016 PCP: System, Pcp Not In    Brief Narrative:   Sabrina Maynard is an 41 y.o. female past medical history of alcoholic cirrhosis, esophageal varices follows with [facility], cirrhosis-induced pancytopenia was admitted for fellowship on Tuesday for detox they comes in with melanotic stools distorted the day of admission and hypotensive in the ER he was found to have a hemoglobin of 9.5 hours a station was started the pathology I was consulted.  Assessment/Plan:   Acute upper GI bleeding/Esophageal varices determined by endoscopy (HCC): Cont. IV PPI and octreotide. GI has been consulted s/p  endoscopy that showed medium sized esophageal varices s/p  banding. Hemoglobin is stable. Allow clear liq diet, check liver ultrasound. She is empirically on IV Rocephin for his BP prophylaxis.  Alcohol abuse: Last  alcohol drinks was 6 days ago. Titrate librium off.  Alcoholic cirrhosis (HCC) with thrombocytopenia: Counseling was done about alcohol only to follow-up with GI[facility].  Mild hyperkalemia: Now resolved.  Essential Hypertension Blood pressure seems to be stable.  Thrombocytopenia: Likely due to alcohol abuse will continue to trend.  DVT prophylaxis: SCD's Family Communication:none Disposition Plan/Barrier to D/C: Keep in SDU Code Status:     Code Status Orders        Start     Ordered   12/10/16 1228  Full code  Continuous     12/10/16 1227    Code Status History    Date Active Date Inactive Code Status Order ID Comments User Context   This patient has a current code status but no historical code status.        IV Access:    Peripheral IV   Procedures and diagnostic studies:   US Abdomen Complete  Result Date: 12/11/2016 CLINICAL DATA:  History of alcoholic cirrhosis with recent upper GI bleed secondary to esophageal varices.  EXAM: 1. ABDOMINAL ULTRASOUND 2. DUPLEX ULTRASOUND OF LIVER TECHNIQUE: Standard protocol abdominal ultrasound was performed followed by color and duplex Doppler ultrasound to evaluate the hepatic in-flow and out-flow vessels. COMPARISON:  None. FINDINGS: Gallbladder: There is nonspecific gallbladder wall thickening and pericholecystic fluid (representative images 9 and 11). No echogenic gallstones or biliary sludge. Negative sonographic Murphy's sign. Common bile duct: Diameter: Normal in size measuring 3.1 mm in diameter Liver: There is increased coarsened echogenicity of the hepatic parenchyma with mild nodularity of the hepatic contour. No discrete hepatic lesions. No intrahepatic bili duct dilatation. No ascites. IVC: No abnormality visualized. Pancreas: Visualized portion unremarkable. Spleen: Borderline enlarged measuring 12 cm 12.3 x 17.0 x 5.4 cm with calculated volume of 567.5 cm cubed Right Kidney: Normal cortical thickness, echogenicity and size, measuring 13.5 cm in length. No focal renal lesions. No echogenic renal stones. No urinary obstruction. Left Kidney: Normal cortical thickness, echogenicity and size, measuring 30.5 cm in length. No focal renal lesions. No echogenic renal stones. No urinary obstruction. Abdominal aorta: No aneurysm visualized. _________________________________________________________ Portal Vein Velocities Main:  26.2 cm/sec Right:  25.5 cm/sec Left:  20.3 cm/sec Hepatic Vein Velocities Right:  67.6 cm/sec Middle:  73.2 cm/sec Left:  36.1 cm/sec Hepatic Artery Velocity:  135.2 cm/sec Splenic Vein Velocity:  22 cm/sec Varices: None visualized Ascites: None visualized IMPRESSION: 1. Nonspecific gallbladder wall thickening and pericholecystic fluid without echogenic stones or biliary sludge. If clinical concern persists for acute acalculous cholecystitis, further evaluation with nuclear medicine HIDA scan could be  performed as indicated. 2. Increased coarsened echogenicity of the  hepatic parenchyma with associated mild nodularity hepatic contour, findings suggestive of cirrhosis. 3. Splenomegaly, likely indicative of portal venous hypertension. No ascites. 4. Widely patent hepatic vascular system. Electronically Signed   By: Simonne Come M.D.   On: 12/11/2016 13:35   [place] Art/ven Flow Abd Pelv Doppler  Result Date: 12/11/2016 CLINICAL DATA:  History of alcoholic cirrhosis with recent upper GI bleed secondary to esophageal varices. EXAM: 1. ABDOMINAL ULTRASOUND 2. DUPLEX ULTRASOUND OF LIVER TECHNIQUE: Standard protocol abdominal ultrasound was performed followed by color and duplex Doppler ultrasound to evaluate the hepatic in-flow and out-flow vessels. COMPARISON:  None. FINDINGS: Gallbladder: There is nonspecific gallbladder wall thickening and pericholecystic fluid (representative images 9 and 11). No echogenic gallstones or biliary sludge. Negative sonographic Murphy's sign. Common bile duct: Diameter: Normal in size measuring 3.1 mm in diameter Liver: There is increased coarsened echogenicity of the hepatic parenchyma with mild nodularity of the hepatic contour. No discrete hepatic lesions. No intrahepatic bili duct dilatation. No ascites. IVC: No abnormality visualized. Pancreas: Visualized portion unremarkable. Spleen: Borderline enlarged measuring 12 cm 12.3 x 17.0 x 5.4 cm with calculated volume of 567.5 cm cubed Right Kidney: Normal cortical thickness, echogenicity and size, measuring 13.5 cm in length. No focal renal lesions. No echogenic renal stones. No urinary obstruction. Left Kidney: Normal cortical thickness, echogenicity and size, measuring 30.5 cm in length. No focal renal lesions. No echogenic renal stones. No urinary obstruction. Abdominal aorta: No aneurysm visualized. _________________________________________________________ Portal Vein Velocities Main:  26.2 cm/sec Right:  25.5 cm/sec Left:  20.3 cm/sec Hepatic Vein Velocities Right:  67.6 cm/sec Middle:  73.2 cm/sec  Left:  36.1 cm/sec Hepatic Artery Velocity:  135.2 cm/sec Splenic Vein Velocity:  22 cm/sec Varices: None visualized Ascites: None visualized IMPRESSION: 1. Nonspecific gallbladder wall thickening and pericholecystic fluid without echogenic stones or biliary sludge. If clinical concern persists for acute acalculous cholecystitis, further evaluation with nuclear medicine HIDA scan could be performed as indicated. 2. Increased coarsened echogenicity of the hepatic parenchyma with associated mild nodularity hepatic contour, findings suggestive of cirrhosis. 3. Splenomegaly, likely indicative of portal venous hypertension. No ascites. 4. Widely patent hepatic vascular system. Electronically Signed   By: Simonne Come M.D.   On: 12/11/2016 13:35   Dg Abd Acute W/chest  Result Date: 12/10/2016 CLINICAL DATA:  Severe abdominal pain after endoscopy today. History of cirrhosis. EXAM: DG ABDOMEN ACUTE W/ 1V CHEST COMPARISON:  None. FINDINGS: Cardiac silhouette is mildly enlarged, mediastinal silhouette is nonsuspicious. Pulmonary vascular congestion without pleural effusion or focal consolidation. No pneumothorax. Soft tissue planes included osseous structures are nonsuspicious. Bowel gas pattern is nondilated and nonobstructive. No intraperitoneal free air. No intra-abdominal mass effect or pathologic calcifications. Soft tissue planes and included osseous structures are normal. IMPRESSION: Mild cardiomegaly and pulmonary vascular congestion. Normal bowel gas pattern. Electronically Signed   By: Awilda Metro M.D.   On: 12/10/2016 23:54     Medical Consultants:    None.  Anti-Infectives:   None  Subjective:    Sabrina Maynard she has no new complaints except she is hungry she will like her diet advanced.  Objective:    Vitals:   12/12/16 0200 12/12/16 0358 12/12/16 0400 12/12/16 0600  BP: 115/63  110/65 106/68  Pulse: 78  75 74  Resp: 18  12 (!) 9  Temp:  98.5 F (36.9 C)    TempSrc:  Oral      SpO2: 92%  99% 91%  Weight:      Height:        Intake/Output Summary (Last 24 hours) at 12/12/16 0737 Last data filed at 12/12/16 0600  Gross per 24 hour  Intake             1698 ml  Output             1300 ml  Net              398 ml   Filed Weights   12/10/16 1317 12/11/16 0500  Weight: 79.8 kg (176 lb) 77 kg (169 lb 12.1 oz)    Exam: General exam: In no acute distress Respiratory system: Good air movement and clear to auscultation. Cardiovascular system: Regular rate and rhythm with positive S1-S2 no murmurs rubs gallops Gastrointestinal system: Bowel sounds soft nontender nondistended Central nervous system: Awake alert and oriented 3 Extremities: No pedal edema Skin: No rashes Psychiatry: Judgment and insight appear intact   Data Reviewed:    Labs: Basic Metabolic Panel:  Recent Labs Lab 12/10/16 0742 12/10/16 0800  12/10/16 1554 12/11/16 0335 12/12/16 0327  NA 140 137  --  142 139 140  K 4.7 5.7*  < > 5.1 3.9 3.5  CL 110 109  --  115* 109 109  CO2  --  22  --  24 22 27   GLUCOSE 136* 135*  --  133* 148* 100*  BUN 26* 21*  --  26* 18 13  CREATININE 0.70 0.75  --  0.62 0.44 0.41*  CALCIUM  --  8.6*  --  8.0* 8.1* 8.4*  < > = values in this interval not displayed. GFR Estimated Creatinine Clearance: 95.7 mL/min (A) (by C-G formula based on SCr of 0.41 mg/dL (L)). Liver Function Tests:  Recent Labs Lab 12/10/16 0800 12/12/16 0327  AST 142* 99*  ALT 61* 64*  ALKPHOS 75 49  BILITOT 2.2* 1.3*  PROT 6.6 6.9  ALBUMIN 2.9* 3.1*   No results for input(s): LIPASE, AMYLASE in the last 168 hours. No results for input(s): AMMONIA in the last 168 hours. Coagulation profile  Recent Labs Lab 12/10/16 0800  INR 1.37    CBC:  Recent Labs Lab 12/10/16 0800  12/10/16 1554 12/10/16 2206 12/11/16 0335 12/11/16 1258 12/12/16 0327  WBC 11.7*  --   --   --  7.3  --  7.2  NEUTROABS 8.8*  --   --   --   --   --  4.9  HGB 9.6*  < > 7.9*  7.9* 8.2*  8.0* 7.8* 7.7*  HCT 28.9*  < > 24.1*  24.0* 25.1* 23.6* 23.4* 23.2*  MCV 106.3*  --   --   --  104.9*  --  105.0*  PLT 77*  --   --   --  56*  --  64*  < > = values in this interval not displayed. Cardiac Enzymes: No results for input(s): CKTOTAL, CKMB, CKMBINDEX, TROPONINI in the last 168 hours. BNP (last 3 results) No results for input(s): PROBNP in the last 8760 hours. CBG: No results for input(s): GLUCAP in the last 168 hours. D-Dimer: No results for input(s): DDIMER in the last 72 hours. Hgb A1c: No results for input(s): HGBA1C in the last 72 hours. Lipid Profile: No results for input(s): CHOL, HDL, LDLCALC, TRIG, CHOLHDL, LDLDIRECT in the last 72 hours. Thyroid function studies:  Recent Labs  12/10/16 1554  TSH 1.643   Anemia work up: No results  for input(s): VITAMINB12, FOLATE, FERRITIN, TIBC, IRON, RETICCTPCT in the last 72 hours. Sepsis Labs:  Recent Labs Lab 12/10/16 0800 12/10/16 0815 12/11/16 0335 12/12/16 0327  WBC 11.7*  --  7.3 7.2  LATICACIDVEN  --  0.62  --   --    Microbiology Recent Results (from the past 240 hour(s))  MRSA PCR Screening     Status: None   Collection Time: 12/10/16 12:30 PM  Result Value Ref Range Status   MRSA by PCR NEGATIVE NEGATIVE Final    Comment:        The GeneXpert MRSA Assay (FDA approved for NASAL specimens only), is one component of a comprehensive MRSA colonization surveillance program. It is not intended to diagnose MRSA infection nor to guide or monitor treatment for MRSA infections.      Medications:   . chlordiazePOXIDE  5 mg Oral TID  . citalopram  40 mg Oral Daily  . docusate sodium  200 mg Oral BID  . folic acid  1 mg Oral Daily  . multivitamin with minerals  1 tablet Oral Daily  . [START ON 12/13/2016] pantoprazole  40 mg Intravenous Q12H  . rifaximin  550 mg Oral BID  . sodium chloride flush  3 mL Intravenous Q12H  . sodium polystyrene  15 g Oral Once  . thiamine  100 mg Oral Daily   Or  .  thiamine  100 mg Intravenous Daily   Continuous Infusions: . cefTRIAXone (ROCEPHIN)  IV Stopped (12/11/16 1014)  . lactated ringers 10 mL (12/12/16 0014)  . octreotide  (SANDOSTATIN)    IV infusion 50 mcg/hr (12/12/16 0300)  . pantoprozole (PROTONIX) infusion 8 mg/hr (12/12/16 0400)    Time spent: 25 min   LOS: 2 days   Marinda Elk  [facility] Pager [phone]  *Please refer to [email or web address], password [email or web address] to get updated schedule on who will round on this patient, as hospitalists switch teams weekly. If 7PM-7AM, please contact night-coverage at [email or web address], password [email or web address] for any overnight needs.  12/12/2016, 7:37 AM

## 2016-12-13 DIAGNOSIS — I10 Essential (primary) hypertension: Secondary | ICD-10-CM

## 2016-12-13 DIAGNOSIS — D696 Thrombocytopenia, unspecified: Secondary | ICD-10-CM

## 2016-12-13 MED ORDER — SODIUM CHLORIDE 0.9 % IV BOLUS (SEPSIS)
1000.0000 mL | Freq: Once | INTRAVENOUS | Status: AC
  Administered 2016-12-13: 1000 mL via INTRAVENOUS

## 2016-12-13 MED ORDER — NADOLOL 20 MG PO TABS
10.0000 mg | ORAL_TABLET | Freq: Every day | ORAL | Status: DC
  Administered 2016-12-14: 10 mg via ORAL
  Filled 2016-12-13 (×2): qty 1

## 2016-12-13 MED ORDER — ZOLPIDEM TARTRATE 5 MG PO TABS: 5.0000 mg | ORAL_TABLET | Freq: Every evening | ORAL | Status: DC | PRN

## 2016-12-13 MED ORDER — LORAZEPAM 1 MG PO TABS
1.0000 mg | ORAL_TABLET | Freq: Four times a day (QID) | ORAL | Status: DC | PRN
  Administered 2016-12-14: 1 mg via ORAL
  Filled 2016-12-13: qty 1

## 2016-12-13 MED ORDER — LORAZEPAM 0.5 MG PO TABS: 0.5000 mg | ORAL_TABLET | Freq: Once | ORAL | Status: DC

## 2016-12-13 NOTE — Progress Notes (Signed)
Progress Note   Subjective  Chief Complaint: Alcoholic Cirrhosis, esophageal varices  This morning the patient is asleep upon entering the room, she tells me she has continued to do well with her diet which she believes was soft yesterday, no abdominal pain. One further small melenic appearing stool yesterday. No dizziness or syncope. Pt tells me she was on a 20mg  Nadalol pill broke in 1/2 at home. No hematemesis.   Objective   Vital signs in last 24 hours: Temp:  [98.1 F (36.7 C)-98.9 F (37.2 C)] 98.1 F (36.7 C) (05/30 0800) Pulse Rate:  [71-84] 71 (05/30 0400) Resp:  [12-22] 19 (05/30 0400) BP: (101-122)/(45-64) 107/46 (05/30 0400) SpO2:  [92 %-99 %] 95 % (05/30 0400) Weight:  [169 lb 15.6 oz (77.1 kg)] 169 lb 15.6 oz (77.1 kg) (05/30 0500) Last BM Date: 12/11/16 General:    white female in NAD Heart:  Regular rate and rhythm; no murmurs Lungs: Respirations even and unlabored, lungs CTA bilaterally Abdomen:  Soft, nontender and nondistended. Normal bowel sounds. Extremities:  Without edema. Neurologic:  Alert and oriented,  grossly normal neurologically. Psych:  Cooperative. Normal mood and affect.  Intake/Output from previous day: 05/29 0701 - 05/30 0700 In: 1130 [I.V.:1030; IV Piggyback:100] Out: 2000 [Urine:2000]  Lab Results:  Recent Labs  12/11/16 0335 12/11/16 1258 12/12/16 0327  WBC 7.3  --  7.2  HGB 8.0* 7.8* 7.7*  HCT 23.6* 23.4* 23.2*  PLT 56*  --  64*   BMET  Recent Labs  12/10/16 1554 12/11/16 0335 12/12/16 0327  NA 142 139 140  K 5.1 3.9 3.5  CL 115* 109 109  CO2 24 22 27   GLUCOSE 133* 148* 100*  BUN 26* 18 13  CREATININE 0.62 0.44 0.41*  CALCIUM 8.0* 8.1* 8.4*   LFT  Recent Labs  12/12/16 0327  PROT 6.9  ALBUMIN 3.1*  AST 99*  ALT 64*  ALKPHOS 49  BILITOT 1.3*    No lab results from today  Studies/Results: US Abdomen Complete  Result Date: 12/11/2016 CLINICAL DATA:  History of alcoholic cirrhosis with recent upper  GI bleed secondary to esophageal varices. EXAM: 1. ABDOMINAL ULTRASOUND 2. DUPLEX ULTRASOUND OF LIVER TECHNIQUE: Standard protocol abdominal ultrasound was performed followed by color and duplex Doppler ultrasound to evaluate the hepatic in-flow and out-flow vessels. COMPARISON:  None. FINDINGS: Gallbladder: There is nonspecific gallbladder wall thickening and pericholecystic fluid (representative images 9 and 11). No echogenic gallstones or biliary sludge. Negative sonographic Murphy's sign. Common bile duct: Diameter: Normal in size measuring 3.1 mm in diameter Liver: There is increased coarsened echogenicity of the hepatic parenchyma with mild nodularity of the hepatic contour. No discrete hepatic lesions. No intrahepatic bili duct dilatation. No ascites. IVC: No abnormality visualized. Pancreas: Visualized portion unremarkable. Spleen: Borderline enlarged measuring 12 cm 12.3 x 17.0 x 5.4 cm with calculated volume of 567.5 cm cubed Right Kidney: Normal cortical thickness, echogenicity and size, measuring 13.5 cm in length. No focal renal lesions. No echogenic renal stones. No urinary obstruction. Left Kidney: Normal cortical thickness, echogenicity and size, measuring 30.5 cm in length. No focal renal lesions. No echogenic renal stones. No urinary obstruction. Abdominal aorta: No aneurysm visualized. _________________________________________________________ Portal Vein Velocities Main:  26.2 cm/sec Right:  25.5 cm/sec Left:  20.3 cm/sec Hepatic Vein Velocities Right:  67.6 cm/sec Middle:  73.2 cm/sec Left:  36.1 cm/sec Hepatic Artery Velocity:  135.2 cm/sec Splenic Vein Velocity:  22 cm/sec Varices: None visualized Ascites: None visualized IMPRESSION: 1.  Nonspecific gallbladder wall thickening and pericholecystic fluid without echogenic stones or biliary sludge. If clinical concern persists for acute acalculous cholecystitis, further evaluation with nuclear medicine HIDA scan could be performed as indicated. 2.  Increased coarsened echogenicity of the hepatic parenchyma with associated mild nodularity hepatic contour, findings suggestive of cirrhosis. 3. Splenomegaly, likely indicative of portal venous hypertension. No ascites. 4. Widely patent hepatic vascular system. Electronically Signed   By: Simonne ComeJohn  Watts M.D.   On: 12/11/2016 13:35   [place] Art/ven Flow Abd Pelv Doppler  Result Date: 12/11/2016 CLINICAL DATA:  History of alcoholic cirrhosis with recent upper GI bleed secondary to esophageal varices. EXAM: 1. ABDOMINAL ULTRASOUND 2. DUPLEX ULTRASOUND OF LIVER TECHNIQUE: Standard protocol abdominal ultrasound was performed followed by color and duplex Doppler ultrasound to evaluate the hepatic in-flow and out-flow vessels. COMPARISON:  None. FINDINGS: Gallbladder: There is nonspecific gallbladder wall thickening and pericholecystic fluid (representative images 9 and 11). No echogenic gallstones or biliary sludge. Negative sonographic Murphy's sign. Common bile duct: Diameter: Normal in size measuring 3.1 mm in diameter Liver: There is increased coarsened echogenicity of the hepatic parenchyma with mild nodularity of the hepatic contour. No discrete hepatic lesions. No intrahepatic bili duct dilatation. No ascites. IVC: No abnormality visualized. Pancreas: Visualized portion unremarkable. Spleen: Borderline enlarged measuring 12 cm 12.3 x 17.0 x 5.4 cm with calculated volume of 567.5 cm cubed Right Kidney: Normal cortical thickness, echogenicity and size, measuring 13.5 cm in length. No focal renal lesions. No echogenic renal stones. No urinary obstruction. Left Kidney: Normal cortical thickness, echogenicity and size, measuring 30.5 cm in length. No focal renal lesions. No echogenic renal stones. No urinary obstruction. Abdominal aorta: No aneurysm visualized. _________________________________________________________ Portal Vein Velocities Main:  26.2 cm/sec Right:  25.5 cm/sec Left:  20.3 cm/sec Hepatic Vein Velocities  Right:  67.6 cm/sec Middle:  73.2 cm/sec Left:  36.1 cm/sec Hepatic Artery Velocity:  135.2 cm/sec Splenic Vein Velocity:  22 cm/sec Varices: None visualized Ascites: None visualized IMPRESSION: 1. Nonspecific gallbladder wall thickening and pericholecystic fluid without echogenic stones or biliary sludge. If clinical concern persists for acute acalculous cholecystitis, further evaluation with nuclear medicine HIDA scan could be performed as indicated. 2. Increased coarsened echogenicity of the hepatic parenchyma with associated mild nodularity hepatic contour, findings suggestive of cirrhosis. 3. Splenomegaly, likely indicative of portal venous hypertension. No ascites. 4. Widely patent hepatic vascular system. Electronically Signed   By: Simonne ComeJohn  Watts M.D.   On: 12/11/2016 13:35       Assessment / Plan:   Assessment: 1. Upper Gi bleed: s/p banding 12/10/16-6 bands, no overt bleeding since, hgb stable 8.0-->7.8-->7.7 2. Alcoholic Cirrhosis: regularly follows with [facility]- they are encouraging alcohol abstinence so pt may be considered for transplant in the future 3. Thrombocytopenia  Plan: 1. Continue Pantoprazole 40mg  PO BID 2. Will d/c Octreotide 3. Patient can be on soft diet today 4. Restarted patient's Nadolol at 10mg  qd, pt reports this was her home dose-will need to monitor bp 5. Will discuss discharge planning with Case manager-would prefer pt be placed in inpatient alcohol rehab at discharge 6. Please await any further recs from Dr. Myrtie Neitheranis later today  Thank you for your kind consultation, we will continue to follow.   LOS: 3 days   Unk LightningJennifer Lynne Lemmon  12/13/2016, 8:53 AM  Pager # [phone]   I have discussed the case with the PA, and that is the plan I formulated. I personally interviewed and examined the patient. Add'l Dx: esophageal varices  with bleeding. Acute blood loss anemia  Although there are no labs today, she shows no signs of re-bleeding from  esophageal varices.  Tolerating a soft diet so far today.  Octreotide changed to nadolol.  PPI oral.  If no evidence of bleeding and labs stable in AM (I ordered some), then she will be stable for discharge tomorrow and to [facility] for alcohol rehab if that is available to her.  I will communicate with Dr. Adela Lank about a repeat EGD in 2-3 weeks to band any remaining varices.  The patient would prefer to have this done locally rather than travelling to [facility] if feasible.    Charlie Pitter III Pager [phone]  Mon-Fri 8a-5p [phone] after 5p, weekends, holidays

## 2016-12-13 NOTE — Progress Notes (Signed)
[facility] Dois DavenportSandra [phone]. Confirms pt's treatment bed at [facility] is being held. Does add that pt will need to be within the medical criteria to determine appropriateness of pt's readmission there once she is stable for DC from hospital.  Advises she will need to speak with RN/medical staff more at that time.   Will continue following to assist with DC planning. Plan: return [facility] once medically stable.   Ilean SkillMeghan Beyza Bellino, MSW, LCSW Clinical Social Work 12/13/2016 [phone]

## 2016-12-13 NOTE — Progress Notes (Signed)
PROGRESS NOTE [facility]   ARITZEL KRUSEMARK   ZOX:[id number] DOB: 16-Jan-1976  DOA: 12/10/2016 PCP: System, Pcp Not In   Brief Narrative:  41 year old female with medical history of Alcohol cirrhosis, history of esophageal varices and thrombocytopenia, and HTN, presented to ER with one day hx of melena associated with low BP. In the ED was found ot be hypotensive with Hgb of 9.5 GI was consulted placed on octreotide and IV PPD, EGD was done which showed medium size esophageal varices with suspected bleeding and banding was done. Patient now tolerating clear liquids well and progressing.   Subjective: Patient seen and examined, patient report feeling better, have no nausea or vomiting. Some residual of dark stool. No other complaints. BP low this AM   Assessment & Plan: Acute upper GI bleed w Esophageal varices  Initially treated with IV PPI and octreotide Status post EGD which showed mixed sites esophageal leukocytosis with 2 areas of suspected bleeding and banding was done Hemoglobin remained stable Advanced diet as tolerated Disposition pending GI clearance Will transfer to med surg   Liver cirrhosis  US shows signs of cirrhosis  Patient on SBP prophylaxis with IV rocephin   Follows [facility] at [facility]   Alcohol abuse  Continue Ativan PRN  Out of withdrawal window by now  Continue to monitor   Essential HTN - now with low BP  BP soft likely from dehydration - will give 1 NS bolus and monitor  Will d/c morphine may be contributing as well   Thrombocytopenia  Alcohol induced  DVT prophylaxis: SCD's  Code Status: FULL Code  Family Communication: None  Disposition Plan: Likely home in next 24 hrs   Consultants:   GI    Procedures:   EGD   Antimicrobials: Antibiotics Given (last 72 hours)    Date/Time Action Medication Dose Rate   12/11/16 0944 New Bag/Given   cefTRIAXone (ROCEPHIN) 2 g in dextrose 5 % 50 mL IVPB 2 g 100 mL/hr   12/11/16 0949 Given   rifaximin  (XIFAXAN) tablet 550 mg 550 mg    12/11/16 2225 Given   rifaximin (XIFAXAN) tablet 550 mg 550 mg    12/12/16 0827 New Bag/Given   cefTRIAXone (ROCEPHIN) 2 g in dextrose 5 % 50 mL IVPB 2 g 100 mL/hr   12/12/16 0827 Given   rifaximin (XIFAXAN) tablet 550 mg 550 mg    12/12/16 2222 Given   rifaximin (XIFAXAN) tablet 550 mg 550 mg    12/13/16 0920 New Bag/Given   cefTRIAXone (ROCEPHIN) 2 g in dextrose 5 % 50 mL IVPB 2 g 100 mL/hr   12/13/16 0928 Given   rifaximin (XIFAXAN) tablet 550 mg 550 mg         Objective: Vitals:   12/13/16 0325 12/13/16 0400 12/13/16 0500 12/13/16 0800  BP:  (!) 107/46    Pulse:  71    Resp:  19    Temp: 98.3 F (36.8 C)   98.1 F (36.7 C)  TempSrc: Oral   Oral  SpO2:  95%    Weight:   77.1 kg (169 lb 15.6 oz)   Height:        Intake/Output Summary (Last 24 hours) at 12/13/16 0835 Last data filed at 12/13/16 0500  Gross per 24 hour  Intake             1130 ml  Output             1700 ml  Net             -  570 ml   Filed Weights   12/10/16 1317 12/11/16 0500 12/13/16 0500  Weight: 79.8 kg (176 lb) 77 kg (169 lb 12.1 oz) 77.1 kg (169 lb 15.6 oz)    Examination:  General exam: NAD Respiratory system: Clear to auscultation. No wheezes,crackle or rhonchi Cardiovascular system: S1 & S2 heard, RRR. No JVD, murmurs, rubs or gallops Gastrointestinal system: Abdomen is nondistended, soft and nontender. No organomegaly or masses felt. Normal bowel sounds heard. Central nervous system: Alert and oriented.  Extremities: No pedal edema.  Skin: No rashes, lesions or ulcers Psychiatry: Judgement and insight appear normal. Mood & affect appropriate.    Data Reviewed: I have personally reviewed following labs and imaging studies  CBC:  Recent Labs Lab 12/10/16 0800  12/10/16 1554 12/10/16 2206 12/11/16 0335 12/11/16 1258 12/12/16 0327  WBC 11.7*  --   --   --  7.3  --  7.2  NEUTROABS 8.8*  --   --   --   --   --  4.9  HGB 9.6*  < > 7.9*  7.9*  8.2* 8.0* 7.8* 7.7*  HCT 28.9*  < > 24.1*  24.0* 25.1* 23.6* 23.4* 23.2*  MCV 106.3*  --   --   --  104.9*  --  105.0*  PLT 77*  --   --   --  56*  --  64*  < > = values in this interval not displayed. Basic Metabolic Panel:  Recent Labs Lab 12/10/16 0742 12/10/16 0800 12/10/16 1008 12/10/16 1554 12/11/16 0335 12/12/16 0327  NA 140 137  --  142 139 140  K 4.7 5.7* 4.6 5.1 3.9 3.5  CL 110 109  --  115* 109 109  CO2  --  22  --  24 22 27   GLUCOSE 136* 135*  --  133* 148* 100*  BUN 26* 21*  --  26* 18 13  CREATININE 0.70 0.75  --  0.62 0.44 0.41*  CALCIUM  --  8.6*  --  8.0* 8.1* 8.4*   GFR: Estimated Creatinine Clearance: 95.9 mL/min (A) (by C-G formula based on SCr of 0.41 mg/dL (L)). Liver Function Tests:  Recent Labs Lab 12/10/16 0800 12/12/16 0327  AST 142* 99*  ALT 61* 64*  ALKPHOS 75 49  BILITOT 2.2* 1.3*  PROT 6.6 6.9  ALBUMIN 2.9* 3.1*   Coagulation Profile:  Recent Labs Lab 12/10/16 0800  INR 1.37     Recent Labs  12/10/16 1554  TSH 1.643   Anemia Panel: No results for input(s): VITAMINB12, FOLATE, FERRITIN, TIBC, IRON, RETICCTPCT in the last 72 hours. Sepsis Labs:  Recent Labs Lab 12/10/16 0815  LATICACIDVEN 0.62    Recent Results (from the past 240 hour(s))  MRSA PCR Screening     Status: None   Collection Time: 12/10/16 12:30 PM  Result Value Ref Range Status   MRSA by PCR NEGATIVE NEGATIVE Final    Comment:        The GeneXpert MRSA Assay (FDA approved for NASAL specimens only), is one component of a comprehensive MRSA colonization surveillance program. It is not intended to diagnose MRSA infection nor to guide or monitor treatment for MRSA infections.      Radiology Studies: US Abdomen Complete  Result Date: 12/11/2016 CLINICAL DATA:  History of alcoholic cirrhosis with recent upper GI bleed secondary to esophageal varices. EXAM: 1. ABDOMINAL ULTRASOUND 2. DUPLEX ULTRASOUND OF LIVER TECHNIQUE: Standard protocol abdominal  ultrasound was performed followed by color and duplex Doppler ultrasound  to evaluate the hepatic in-flow and out-flow vessels. COMPARISON:  None. FINDINGS: Gallbladder: There is nonspecific gallbladder wall thickening and pericholecystic fluid (representative images 9 and 11). No echogenic gallstones or biliary sludge. Negative sonographic Murphy's sign. Common bile duct: Diameter: Normal in size measuring 3.1 mm in diameter Liver: There is increased coarsened echogenicity of the hepatic parenchyma with mild nodularity of the hepatic contour. No discrete hepatic lesions. No intrahepatic bili duct dilatation. No ascites. IVC: No abnormality visualized. Pancreas: Visualized portion unremarkable. Spleen: Borderline enlarged measuring 12 cm 12.3 x 17.0 x 5.4 cm with calculated volume of 567.5 cm cubed Right Kidney: Normal cortical thickness, echogenicity and size, measuring 13.5 cm in length. No focal renal lesions. No echogenic renal stones. No urinary obstruction. Left Kidney: Normal cortical thickness, echogenicity and size, measuring 30.5 cm in length. No focal renal lesions. No echogenic renal stones. No urinary obstruction. Abdominal aorta: No aneurysm visualized. _________________________________________________________ Portal Vein Velocities Main:  26.2 cm/sec Right:  25.5 cm/sec Left:  20.3 cm/sec Hepatic Vein Velocities Right:  67.6 cm/sec Middle:  73.2 cm/sec Left:  36.1 cm/sec Hepatic Artery Velocity:  135.2 cm/sec Splenic Vein Velocity:  22 cm/sec Varices: None visualized Ascites: None visualized IMPRESSION: 1. Nonspecific gallbladder wall thickening and pericholecystic fluid without echogenic stones or biliary sludge. If clinical concern persists for acute acalculous cholecystitis, further evaluation with nuclear medicine HIDA scan could be performed as indicated. 2. Increased coarsened echogenicity of the hepatic parenchyma with associated mild nodularity hepatic contour, findings suggestive of cirrhosis.  3. Splenomegaly, likely indicative of portal venous hypertension. No ascites. 4. Widely patent hepatic vascular system. Electronically Signed   By: Simonne ComeJohn  Watts M.D.   On: 12/11/2016 13:35   [place] Art/ven Flow Abd Pelv Doppler  Result Date: 12/11/2016 CLINICAL DATA:  History of alcoholic cirrhosis with recent upper GI bleed secondary to esophageal varices. EXAM: 1. ABDOMINAL ULTRASOUND 2. DUPLEX ULTRASOUND OF LIVER TECHNIQUE: Standard protocol abdominal ultrasound was performed followed by color and duplex Doppler ultrasound to evaluate the hepatic in-flow and out-flow vessels. COMPARISON:  None. FINDINGS: Gallbladder: There is nonspecific gallbladder wall thickening and pericholecystic fluid (representative images 9 and 11). No echogenic gallstones or biliary sludge. Negative sonographic Murphy's sign. Common bile duct: Diameter: Normal in size measuring 3.1 mm in diameter Liver: There is increased coarsened echogenicity of the hepatic parenchyma with mild nodularity of the hepatic contour. No discrete hepatic lesions. No intrahepatic bili duct dilatation. No ascites. IVC: No abnormality visualized. Pancreas: Visualized portion unremarkable. Spleen: Borderline enlarged measuring 12 cm 12.3 x 17.0 x 5.4 cm with calculated volume of 567.5 cm cubed Right Kidney: Normal cortical thickness, echogenicity and size, measuring 13.5 cm in length. No focal renal lesions. No echogenic renal stones. No urinary obstruction. Left Kidney: Normal cortical thickness, echogenicity and size, measuring 30.5 cm in length. No focal renal lesions. No echogenic renal stones. No urinary obstruction. Abdominal aorta: No aneurysm visualized. _________________________________________________________ Portal Vein Velocities Main:  26.2 cm/sec Right:  25.5 cm/sec Left:  20.3 cm/sec Hepatic Vein Velocities Right:  67.6 cm/sec Middle:  73.2 cm/sec Left:  36.1 cm/sec Hepatic Artery Velocity:  135.2 cm/sec Splenic Vein Velocity:  22 cm/sec Varices:  None visualized Ascites: None visualized IMPRESSION: 1. Nonspecific gallbladder wall thickening and pericholecystic fluid without echogenic stones or biliary sludge. If clinical concern persists for acute acalculous cholecystitis, further evaluation with nuclear medicine HIDA scan could be performed as indicated. 2. Increased coarsened echogenicity of the hepatic parenchyma with associated mild nodularity hepatic contour, findings suggestive of  cirrhosis. 3. Splenomegaly, likely indicative of portal venous hypertension. No ascites. 4. Widely patent hepatic vascular system. Electronically Signed   By: Simonne Come M.D.   On: 12/11/2016 13:35    Scheduled Meds: . citalopram  40 mg Oral Daily  . docusate sodium  200 mg Oral BID  . folic acid  1 mg Oral Daily  . multivitamin with minerals  1 tablet Oral Daily  . pantoprazole  40 mg Oral QAC breakfast  . rifaximin  550 mg Oral BID  . sodium chloride flush  3 mL Intravenous Q12H  . sodium polystyrene  15 g Oral Once  . thiamine  100 mg Oral Daily   Or  . thiamine  100 mg Intravenous Daily   Continuous Infusions: . cefTRIAXone (ROCEPHIN)  IV Stopped (12/12/16 0857)  . lactated ringers 10 mL/hr at 12/13/16 0500  . octreotide  (SANDOSTATIN)    IV infusion 50 mcg/hr (12/13/16 0500)  . sodium chloride       LOS: 3 days    Latrelle Dodrill, MD Pager: Text Page via [email or web address]  [phone]  If 7PM-7AM, please contact night-coverage [email or web address] Password [email or web address] 12/13/2016, 8:35 AM

## 2016-12-13 NOTE — Clinical Social Work Note (Signed)
Clinical Social Work Assessment  Patient Details  Name: Sabrina Maynard MRN: [id number] Date of Birth: [birth year]  Date of referral:  12/13/16               Reason for consult:  Facility Placement, Discharge Planning, Substance Use/ETOH Abuse                Permission sought to share information with:  Chartered certified accountant granted to share information::  Yes, Verbal Permission Granted  Name::        Agency::     Relationship::     Contact Information:     Housing/Transportation Living arrangements for the past 2 months:  Single Family Home Source of Information:  Patient Patient Interpreter Needed:  None Criminal Activity/Legal Involvement Pertinent to Current Situation/Hospitalization:  No - Comment as needed Significant Relationships:  Parents, Spouse Lives with:  Spouse Do you feel safe going back to the place where you live?   (Requesting to return to [facility].) Need for family participation in patient care:  No (Coment)  Care giving concerns:  No family at bedside. No concerns reported by pt.   Social Worker assessment / plan:  Pt hospitalized from [facility] on 12/10/16 with melena and hypotension. Pt has a hx of alcoholic cirrhosis. CSW met briefly with pt at bedside this afternoon. Pt was very sleepy but able to report that she was admitted from [place] and would like to return at d/c. Pt gave CSW permission to contact [facility]. A VM was left NSG to contact CSW for assistance with readmission to facility once pt is stable. CSW will continue to follow to assist with d/c planning.  Employment status:  Unemployed Forensic scientist:  Managed Care PT Recommendations:  Not assessed at this time Information / Referral to community resources:     Patient/Family's Response to care:  Pt requesting to return to [facility] at d/c.  Patient/Family's Understanding of and Emotional Response to Diagnosis, Current Treatment, and  Prognosis: Still assessing. Pt was too sleepy to participate in detailed conversation.  Emotional Assessment Appearance:  Appears stated age Attitude/Demeanor/Rapport:  Unable to Assess Affect (typically observed):  Unable to Assess Orientation:  Oriented to Self, Oriented to Place, Oriented to  Time, Oriented to Situation (NSG documented oriented x4) Alcohol / Substance use:  Alcohol Use Psych involvement (Current and /or in the community):  No (Comment)  Discharge Needs  Concerns to be addressed:  Discharge Planning Concerns, Substance Abuse Concerns Readmission within the last 30 days:  No Current discharge risk:  None Barriers to Discharge:  No Barriers Identified   Loraine Maple  [phone] 12/13/2016, 12:55 PM

## 2016-12-14 ENCOUNTER — Telehealth: Payer: Self-pay

## 2016-12-14 LAB — CBC WITH DIFFERENTIAL/PLATELET
BASOS ABS: 0 10*3/uL (ref 0.0–0.1)
Basophils Relative: 1 %
EOS PCT: 2 %
Eosinophils Absolute: 0.2 10*3/uL (ref 0.0–0.7)
HCT: 23.5 % — ABNORMAL LOW (ref 36.0–46.0)
HEMOGLOBIN: 8.1 g/dL — AB (ref 12.0–15.0)
LYMPHS ABS: 1 10*3/uL (ref 0.7–4.0)
Lymphocytes Relative: 15 %
MCH: 35.4 pg — ABNORMAL HIGH (ref 26.0–34.0)
MCHC: 34.5 g/dL (ref 30.0–36.0)
MCV: 102.6 fL — AB (ref 78.0–100.0)
Monocytes Absolute: 0.8 10*3/uL (ref 0.1–1.0)
Monocytes Relative: 12 %
NEUTROS ABS: 4.6 10*3/uL (ref 1.7–7.7)
NEUTROS PCT: 71 %
PLATELETS: 77 10*3/uL — AB (ref 150–400)
RBC: 2.29 MIL/uL — AB (ref 3.87–5.11)
RDW: 14.9 % (ref 11.5–15.5)
WBC: 6.5 10*3/uL (ref 4.0–10.5)

## 2016-12-14 MED ORDER — PANTOPRAZOLE SODIUM 40 MG PO TBEC: 40.0000 mg | DELAYED_RELEASE_TABLET | Freq: Two times a day (BID) | ORAL | 0 refills | Status: AC

## 2016-12-14 MED ORDER — FOLIC ACID 1 MG PO TABS: 1.0000 mg | ORAL_TABLET | Freq: Every day | ORAL | 0 refills | Status: DC

## 2016-12-14 MED ORDER — THIAMINE HCL 100 MG PO TABS: 100.0000 mg | ORAL_TABLET | Freq: Every day | ORAL | 0 refills | Status: AC

## 2016-12-14 MED ORDER — RIFAXIMIN 550 MG PO TABS: 550.0000 mg | ORAL_TABLET | Freq: Two times a day (BID) | ORAL | 0 refills | Status: DC

## 2016-12-14 NOTE — Discharge Summary (Signed)
Physician Discharge Summary  Sabrina Maynard  ZOX:[id number]  DOB: 12/01/1975  DOA: 12/10/2016 PCP: System, Pcp Not In  Admit date: 12/10/2016 Discharge date: 12/14/2016  Admitted From: [facility]   Disposition:  [facility]  Recommendations for Outpatient Follow-up:  1. Follow up with PCP in 1-2 weeks 2. Please obtain BMP/CBC in one week to monitor Hgb and Kidney function  3. Repeat EGD in 2-3 weeks with Dr Ellin SabaArmbuster   Discharge Condition: Stable  CODE STATUS: FULL  Diet recommendation: Heart Healthy    Brief/Interim Summary: 41 year old female with medical history of Alcohol cirrhosis, history of esophageal varices and thrombocytopenia, and HTN, presented to ER with one day hx of melena associated with low BP. In the ED was found ot be hypotensive with Hgb of 9.5 GI was consulted placed on octreotide and IV PPD, EGD was done which showed medium size esophageal varices with suspected bleeding and banding was done. Patient continued to improved and clinically better. Hgb has been stable with no more signs of bleeding. Patient is tolerating diet well. Patient will be discharge to complete inpatient alcohol rehab.   Subjective: Patient seen and examined on the day of discharge, doing well, today. Did not sleep well last night. Has small "dark stool" last night, no BM today. Denies N/V/D. No acute events overnight. Patient tolerating diet well, and remains afebrile.   Discharge Diagnoses/Hospital Course:  Acute upper GI bleed w Esophageal varices  Initially treated with IV PPI and octreotide Status post EGD which showed mixed sites esophageal varices with 2 areas of suspected bleeding and banding was done Hemoglobin remained stable 8.1 upon discharge  Will need repeat EGD in 2-3 weeks - GI will arrange appointment and contact patient with information  Liver cirrhosis  US shows signs of cirrhosis  Treated with Rocephin for SBP prophylaxis during hospital stay, will d/c as no  ascites was found on US  Patient started on Rifaximin will continue for now  [facility]  Alcohol abstinence advised   Alcohol abuse  Out of withdrawal window by now  Patient will be discharge to alcohol rehab program, [facility]   Essential HTN - BP  stable now  Patient on Nadolol for esophageal varices  Will continue current medication  Monitor BP occasionally    Thrombocytopenia - stable and improving Alcohol induced  All other chronic medical condition were stable during the hospitalization.  On the day of the discharge the patient's vitals were stable, and no other acute medical condition were reported by patient. Patient was felt safe to be discharge to [facility]   Discharge Instructions  You were cared for by a hospitalist during your hospital stay. If you have any questions about your discharge medications or the care you received while you were in the hospital after you are discharged, you can call the unit and asked to speak with the hospitalist on call if the hospitalist that took care of you is not available. Once you are discharged, your primary care physician will handle any further medical issues. Please note that NO REFILLS for any discharge medications will be authorized once you are discharged, as it is imperative that you return to your primary care physician (or establish a relationship with a primary care physician if you do not have one) for your aftercare needs so that they can reassess your need for medications and monitor your lab values.  Discharge Instructions    Call MD for:  difficulty breathing, headache or  visual disturbances    Complete by:  As directed    Call MD for:  extreme fatigue    Complete by:  As directed    Call MD for:  hives    Complete by:  As directed    Call MD for:  persistant dizziness or light-headedness    Complete by:  As directed    Call MD for:  persistant nausea and vomiting    Complete by:   As directed    Call MD for:  redness, tenderness, or signs of infection (pain, swelling, redness, odor or green/yellow discharge around incision site)    Complete by:  As directed    Call MD for:  severe uncontrolled pain    Complete by:  As directed    Call MD for:  temperature >100.4    Complete by:  As directed    Diet - low sodium heart healthy    Complete by:  As directed    Discharge instructions    Complete by:  As directed    Alcohol abstinence  Follow up with PCP in 1-2 weeks   Increase activity slowly    Complete by:  As directed      Allergies as of 12/14/2016      Reactions   Crab [shellfish Allergy] Nausea And Vomiting   Erythromycin       Medication List    TAKE these medications   citalopram 40 MG tablet Commonly known as:  CELEXA Take 40 mg by mouth every evening.   folic acid 1 MG tablet Commonly known as:  FOLVITE Take 1 tablet (1 mg total) by mouth daily. Start taking on:  12/15/2016   ibuprofen 200 MG tablet Commonly known as:  ADVIL,MOTRIN Take 400-600 mg by mouth every 6 (six) hours as needed for mild pain or moderate pain.   nadolol 20 MG tablet Commonly known as:  CORGARD Take 10 mg by mouth daily.   pantoprazole 40 MG tablet Commonly known as:  PROTONIX Take 1 tablet (40 mg total) by mouth 2 (two) times daily.   rifaximin 550 MG Tabs tablet Commonly known as:  XIFAXAN Take 1 tablet (550 mg total) by mouth 2 (two) times daily.   thiamine 100 MG tablet Take 1 tablet (100 mg total) by mouth daily. Start taking on:  12/15/2016      Follow-up Information    Armbruster, Reeves Forth, MD. Schedule an appointment as soon as possible for a visit in 2 week(s).   Specialty:  Gastroenterology Why:  Hospital follow up Contact information: [address] [place] [address] [phone]          Allergies  Allergen Reactions  . Crab [Shellfish Allergy] Nausea And Vomiting  . Erythromycin     Consultations:  GI -  [place]   Procedures/Studies: US Abdomen Complete  Result Date: 12/11/2016 CLINICAL DATA:  History of alcoholic cirrhosis with recent upper GI bleed secondary to esophageal varices. EXAM: 1. ABDOMINAL ULTRASOUND 2. DUPLEX ULTRASOUND OF LIVER TECHNIQUE: Standard protocol abdominal ultrasound was performed followed by color and duplex Doppler ultrasound to evaluate the hepatic in-flow and out-flow vessels. COMPARISON:  None. FINDINGS: Gallbladder: There is nonspecific gallbladder wall thickening and pericholecystic fluid (representative images 9 and 11). No echogenic gallstones or biliary sludge. Negative sonographic Murphy's sign. Common bile duct: Diameter: Normal in size measuring 3.1 mm in diameter Liver: There is increased coarsened echogenicity of the hepatic parenchyma with mild nodularity of the hepatic contour. No discrete  hepatic lesions. No intrahepatic bili duct dilatation. No ascites. IVC: No abnormality visualized. Pancreas: Visualized portion unremarkable. Spleen: Borderline enlarged measuring 12 cm 12.3 x 17.0 x 5.4 cm with calculated volume of 567.5 cm cubed Right Kidney: Normal cortical thickness, echogenicity and size, measuring 13.5 cm in length. No focal renal lesions. No echogenic renal stones. No urinary obstruction. Left Kidney: Normal cortical thickness, echogenicity and size, measuring 30.5 cm in length. No focal renal lesions. No echogenic renal stones. No urinary obstruction. Abdominal aorta: No aneurysm visualized. _________________________________________________________ Portal Vein Velocities Main:  26.2 cm/sec Right:  25.5 cm/sec Left:  20.3 cm/sec Hepatic Vein Velocities Right:  67.6 cm/sec Middle:  73.2 cm/sec Left:  36.1 cm/sec Hepatic Artery Velocity:  135.2 cm/sec Splenic Vein Velocity:  22 cm/sec Varices: None visualized Ascites: None visualized IMPRESSION: 1. Nonspecific gallbladder wall thickening and pericholecystic fluid without echogenic stones or biliary sludge. If  clinical concern persists for acute acalculous cholecystitis, further evaluation with nuclear medicine HIDA scan could be performed as indicated. 2. Increased coarsened echogenicity of the hepatic parenchyma with associated mild nodularity hepatic contour, findings suggestive of cirrhosis. 3. Splenomegaly, likely indicative of portal venous hypertension. No ascites. 4. Widely patent hepatic vascular system. Electronically Signed   By: Simonne Come M.D.   On: 12/11/2016 13:35   [place] Art/ven Flow Abd Pelv Doppler  Result Date: 12/11/2016 CLINICAL DATA:  History of alcoholic cirrhosis with recent upper GI bleed secondary to esophageal varices. EXAM: 1. ABDOMINAL ULTRASOUND 2. DUPLEX ULTRASOUND OF LIVER TECHNIQUE: Standard protocol abdominal ultrasound was performed followed by color and duplex Doppler ultrasound to evaluate the hepatic in-flow and out-flow vessels. COMPARISON:  None. FINDINGS: Gallbladder: There is nonspecific gallbladder wall thickening and pericholecystic fluid (representative images 9 and 11). No echogenic gallstones or biliary sludge. Negative sonographic Murphy's sign. Common bile duct: Diameter: Normal in size measuring 3.1 mm in diameter Liver: There is increased coarsened echogenicity of the hepatic parenchyma with mild nodularity of the hepatic contour. No discrete hepatic lesions. No intrahepatic bili duct dilatation. No ascites. IVC: No abnormality visualized. Pancreas: Visualized portion unremarkable. Spleen: Borderline enlarged measuring 12 cm 12.3 x 17.0 x 5.4 cm with calculated volume of 567.5 cm cubed Right Kidney: Normal cortical thickness, echogenicity and size, measuring 13.5 cm in length. No focal renal lesions. No echogenic renal stones. No urinary obstruction. Left Kidney: Normal cortical thickness, echogenicity and size, measuring 30.5 cm in length. No focal renal lesions. No echogenic renal stones. No urinary obstruction. Abdominal aorta: No aneurysm visualized.  _________________________________________________________ Portal Vein Velocities Main:  26.2 cm/sec Right:  25.5 cm/sec Left:  20.3 cm/sec Hepatic Vein Velocities Right:  67.6 cm/sec Middle:  73.2 cm/sec Left:  36.1 cm/sec Hepatic Artery Velocity:  135.2 cm/sec Splenic Vein Velocity:  22 cm/sec Varices: None visualized Ascites: None visualized IMPRESSION: 1. Nonspecific gallbladder wall thickening and pericholecystic fluid without echogenic stones or biliary sludge. If clinical concern persists for acute acalculous cholecystitis, further evaluation with nuclear medicine HIDA scan could be performed as indicated. 2. Increased coarsened echogenicity of the hepatic parenchyma with associated mild nodularity hepatic contour, findings suggestive of cirrhosis. 3. Splenomegaly, likely indicative of portal venous hypertension. No ascites. 4. Widely patent hepatic vascular system. Electronically Signed   By: Simonne Come M.D.   On: 12/11/2016 13:35   Dg Abd Acute W/chest  Result Date: 12/10/2016 CLINICAL DATA:  Severe abdominal pain after endoscopy today. History of cirrhosis. EXAM: DG ABDOMEN ACUTE W/ 1V CHEST COMPARISON:  None. FINDINGS: Cardiac silhouette is mildly  enlarged, mediastinal silhouette is nonsuspicious. Pulmonary vascular congestion without pleural effusion or focal consolidation. No pneumothorax. Soft tissue planes included osseous structures are nonsuspicious. Bowel gas pattern is nondilated and nonobstructive. No intraperitoneal free air. No intra-abdominal mass effect or pathologic calcifications. Soft tissue planes and included osseous structures are normal. IMPRESSION: Mild cardiomegaly and pulmonary vascular congestion. Normal bowel gas pattern. Electronically Signed   By: Awilda Metro M.D.   On: 12/10/2016 23:54   EGD 12/11/2016  Discharge Exam: Vitals:   12/13/16 2358 12/14/16 0545  BP: (!) 106/43 (!) 110/55  Pulse: 74 75  Resp: 14 16  Temp: 98.7 F (37.1 C) 98.8 F (37.1 C)    Vitals:   12/13/16 1200 12/13/16 1500 12/13/16 2358 12/14/16 0545  BP: (!) 110/51 (!) 108/57 (!) 106/43 (!) 110/55  Pulse:  67 74 75  Resp: 19  14 16   Temp: 98.4 F (36.9 C) 99.3 F (37.4 C) 98.7 F (37.1 C) 98.8 F (37.1 C)  TempSrc: Oral Oral Oral Oral  SpO2:  99% 98% 100%  Weight:    72.4 kg (159 lb 11.2 oz)  Height:        General: Pt is alert, awake, not in acute distress Cardiovascular: RRR, S1/S2 +, no rubs, no gallops Respiratory: CTA bilaterally, no wheezing, no rhonchi Abdominal: Soft, NT, ND, bowel sounds + Extremities: no edema, no cyanosis   The results of significant diagnostics from this hospitalization (including imaging, microbiology, ancillary and laboratory) are listed below for reference.     Microbiology: Recent Results (from the past 240 hour(s))  MRSA PCR Screening     Status: None   Collection Time: 12/10/16 12:30 PM  Result Value Ref Range Status   MRSA by PCR NEGATIVE NEGATIVE Final    Comment:        The GeneXpert MRSA Assay (FDA approved for NASAL specimens only), is one component of a comprehensive MRSA colonization surveillance program. It is not intended to diagnose MRSA infection nor to guide or monitor treatment for MRSA infections.      Labs: BNP (last 3 results) No results for input(s): BNP in the last 8760 hours. Basic Metabolic Panel:  Recent Labs Lab 12/10/16 0742 12/10/16 0800 12/10/16 1008 12/10/16 1554 12/11/16 0335 12/12/16 0327  NA 140 137  --  142 139 140  K 4.7 5.7* 4.6 5.1 3.9 3.5  CL 110 109  --  115* 109 109  CO2  --  22  --  24 22 27   GLUCOSE 136* 135*  --  133* 148* 100*  BUN 26* 21*  --  26* 18 13  CREATININE 0.70 0.75  --  0.62 0.44 0.41*  CALCIUM  --  8.6*  --  8.0* 8.1* 8.4*   Liver Function Tests:  Recent Labs Lab 12/10/16 0800 12/12/16 0327  AST 142* 99*  ALT 61* 64*  ALKPHOS 75 49  BILITOT 2.2* 1.3*  PROT 6.6 6.9  ALBUMIN 2.9* 3.1*   No results for input(s): LIPASE, AMYLASE in  the last 168 hours. No results for input(s): AMMONIA in the last 168 hours. CBC:  Recent Labs Lab 12/10/16 0800  12/10/16 2206 12/11/16 0335 12/11/16 1258 12/12/16 0327 12/14/16 0535  WBC 11.7*  --   --  7.3  --  7.2 6.5  NEUTROABS 8.8*  --   --   --   --  4.9 4.6  HGB 9.6*  < > 8.2* 8.0* 7.8* 7.7* 8.1*  HCT 28.9*  < > 25.1* 23.6* 23.4*  23.2* 23.5*  MCV 106.3*  --   --  104.9*  --  105.0* 102.6*  PLT 77*  --   --  56*  --  64* 77*  < > = values in this interval not displayed. Cardiac Enzymes: No results for input(s): CKTOTAL, CKMB, CKMBINDEX, TROPONINI in the last 168 hours. BNP: Invalid input(s): POCBNP CBG: No results for input(s): GLUCAP in the last 168 hours. D-Dimer No results for input(s): DDIMER in the last 72 hours. Hgb A1c No results for input(s): HGBA1C in the last 72 hours. Lipid Profile No results for input(s): CHOL, HDL, LDLCALC, TRIG, CHOLHDL, LDLDIRECT in the last 72 hours. Thyroid function studies No results for input(s): TSH, T4TOTAL, T3FREE, THYROIDAB in the last 72 hours.  Invalid input(s): FREET3 Anemia work up No results for input(s): VITAMINB12, FOLATE, FERRITIN, TIBC, IRON, RETICCTPCT in the last 72 hours. Urinalysis No results found for: COLORURINE, APPEARANCEUR, LABSPEC, PHURINE, GLUCOSEU, HGBUR, BILIRUBINUR, KETONESUR, PROTEINUR, UROBILINOGEN, NITRITE, LEUKOCYTESUR Sepsis Labs Invalid input(s): PROCALCITONIN,  WBC,  LACTICIDVEN Microbiology Recent Results (from the past 240 hour(s))  MRSA PCR Screening     Status: None   Collection Time: 12/10/16 12:30 PM  Result Value Ref Range Status   MRSA by PCR NEGATIVE NEGATIVE Final    Comment:        The GeneXpert MRSA Assay (FDA approved for NASAL specimens only), is one component of a comprehensive MRSA colonization surveillance program. It is not intended to diagnose MRSA infection nor to guide or monitor treatment for MRSA infections.     Time coordinating discharge:  32  minutes  SIGNED:  Latrelle Dodrill, MD  [facility] 12/14/2016, 12:57 PM  Pager please text page via  [email or web address] Password [email or web address]

## 2016-12-14 NOTE — Telephone Encounter (Signed)
Pt will be going to an ETOH rehab [facility] and may be need to called at the facility.  Message sent to [facility] regarding location of the EGD.

## 2016-12-14 NOTE — Progress Notes (Signed)
    Progress Note   Subjective  Chief Complaint:Alcoholic cirrhosis, esophageal varices  This morning, the patient is surrounded by her mother and aunt, she is currently eating her soft diet. She tells me she has been doing well overnight she did have one further "dark" stool this morning. She has done well since addition of Nadolol 10 mg yesterday. No further hematemesis.    Objective   Vital signs in last 24 hours: Temp:  [98.7 F (37.1 C)-99.3 F (37.4 C)] 98.8 F (37.1 C) (05/31 0545) Pulse Rate:  [67-75] 75 (05/31 0545) Resp:  [14-16] 16 (05/31 0545) BP: (106-110)/(43-57) 110/55 (05/31 0545) SpO2:  [98 %-100 %] 100 % (05/31 0545) Weight:  [159 lb 11.2 oz (72.4 kg)] 159 lb 11.2 oz (72.4 kg) (05/31 0545) Last BM Date: 12/14/16 General:    white female in NAD Heart:  Regular rate and rhythm; no murmurs Lungs: Respirations even and unlabored, lungs CTA bilaterally Abdomen:  Soft, nontender and nondistended. Normal bowel sounds. Extremities:  Without edema. Neurologic:  Alert and oriented,  grossly normal neurologically. Psych:  Cooperative. Normal mood and affect.  Intake/Output from previous day: 05/30 0701 - 05/31 0700 In: 1750 [P.O.:600; I.V.:100; IV Piggyback:1050] Out: -  Intake/Output this shift: Total I/O In: 420 [P.O.:420] Out: -   Lab Results:  Recent Labs  12/11/16 1258 12/12/16 0327 12/14/16 0535  WBC  --  7.2 6.5  HGB 7.8* 7.7* 8.1*  HCT 23.4* 23.2* 23.5*  PLT  --  64* 77*   BMET  Recent Labs  12/12/16 0327  NA 140  K 3.5  CL 109  CO2 27  GLUCOSE 100*  BUN 13  CREATININE 0.41*  CALCIUM 8.4*   LFT  Recent Labs  12/12/16 0327  PROT 6.9  ALBUMIN 3.1*  AST 99*  ALT 64*  ALKPHOS 49  BILITOT 1.3*    Assessment / Plan:   Assessment: 1. Upper GI bleed: Status post banding 12/10/16-6 bands, no overt bleeding since, hemoglobin stable 8.0-->7.8-->7.7-->8.1 2. Alcoholic cirrhosis: [facility]-encouraging  alcohol abstinence so that the patient may be considered for transplant in the future 3. Thrombocytopenia  Plan: 1. Continue pantoprazole 40 mg by mouth twice a day 2. Continue Nadolol 10 mg daily 3. Patient may continue on her current diet 4. Arrangements are being made for hopeful transport to rehabilitation when patient is discharged, this was discussed with case management yesterday 5. Patient will be arranged for repeat EGD in 2-3 weeks with Dr. Adela LankArmbruster. We will call her regarding this appointment. 6. Please await any further recommendations from Dr. Myrtie Neitheranis, we will sign off today  Thank you for your kind consultation    LOS: 4 days   Unk LightningJennifer Lynne Lemmon  12/14/2016, 12:27 PM  Pager # [phone]  I have discussed the case with the PA, and that is the plan I formulated. I personally interviewed and examined the patient.  Patient stable without further bleeding.  Tolerating regular diet and on beta blocker, antacid.  Our office will arrange EGD in 2-3 weeks and communicate directly to [facility] about this. Patient's mother agrees to be contacted if necessary.  I also told her she does not need the rifaximin as she has not previously had HE.  It was only given here during acute bleed.    Charlie PitterHenry L Danis III Pager [phone]  Mon-Fri 8a-5p [phone] after 5p, weekends, holidays

## 2016-12-14 NOTE — Telephone Encounter (Signed)
[facility]?

## 2016-12-14 NOTE — Progress Notes (Addendum)
Spoke with Publishing copynursing director at [facility] who states Wellsite geologistmedical director has approved for pt to return to [facility] for treatment today. States once pt DC, facility will transport. CSW will follow to assist.  Updated MD and RN re: pt's planned return to [facility].   Ilean SkillMeghan Elazar Argabright, MSW, LCSW Clinical Social Work 12/14/2016 [phone]  13:45- PT discharging to [facility]. RN has called report to facility and CSW spoke with Aurther Lofterry who states facility staff will be coming to hospital between 15:00-15:30 to transport pt back to [facility].   Family notified- pt's mother at bedside. No other needs at this time.  [facility] this [facility].

## 2016-12-14 NOTE — Telephone Encounter (Signed)
-----   Message from [facility] Lynne Chadds FordLemmon, GeorgiaPA sent at 12/14/2016 12:30 PM EDT ----- Regarding: egd Patient needs to be arranged for repeat EGD in 2-3 weeks after recent banding and possible repeat banding with Dr. Adela LankArmbruster. Please arrange. Thank you-JLL

## 2016-12-15 ENCOUNTER — Telehealth: Payer: Self-pay

## 2016-12-15 NOTE — Telephone Encounter (Signed)
Ok. Thanks!

## 2016-12-15 NOTE — Telephone Encounter (Signed)
[facility] on [facility]/12, she is also scheduled for a pre-visit appointment. I called the number listed for the patient, it went to a Dr. Karilyn Cotaarol Peterson, I did leave a message to have them call back. I looked at patient's demographics and this is her mother. I also called her husband and left a message for him to call our office that I have information about appointments.

## 2016-12-15 NOTE — Telephone Encounter (Signed)
Oh thank you, but I have been working on trying to schedule follow up EGD w/ banding. I'm still waiting to hear back from Dr. Adela LankArmbruster.

## 2016-12-15 NOTE — Telephone Encounter (Signed)
Only hospital case because she may need rebanding. Thanks! JLL

## 2016-12-15 NOTE — Telephone Encounter (Signed)
-----   Message from Ruffin FrederickSteven Paul Armbruster, MD sent at 12/15/2016  8:56 AM EDT ----- Regarding: follow up EGD Morton PetersHi [name], Not sure if Sherilyn CooterHenry gave you a message about this patient. She needs an EGD in around 2 weeks or so, done at the hospital for follow up banding of varices. Do I have any openings on a half day hospital coming up? If not, any 730 slots open?   Thanks much

## 2016-12-15 NOTE — Telephone Encounter (Signed)
Sabrina FanningJulie,  Where can I put this Armbruster pt?  [facility] in 2-3 weeks.

## 2016-12-19 ENCOUNTER — Telehealth: Payer: Self-pay

## 2016-12-19 NOTE — Telephone Encounter (Signed)
I had left a message for patient's husband and her mother last week. I had not heard back from either of them. Called husband today, he is aware of patient's pre-visit appointment on 6/6 and EGD on 6/12. He will relay this information to the care team where patient is in a recovery program.

## 2016-12-20 ENCOUNTER — Telehealth: Payer: Self-pay | Admitting: *Deleted

## 2016-12-20 ENCOUNTER — Ambulatory Visit (AMBULATORY_SURGERY_CENTER): Payer: Self-pay | Admitting: *Deleted

## 2016-12-20 VITALS — Ht 64.0 in | Wt 168.0 lb

## 2016-12-20 DIAGNOSIS — I8501 Esophageal varices with bleeding: Secondary | ICD-10-CM

## 2016-12-20 NOTE — Telephone Encounter (Signed)
FYI.... Patient is currently staying at [facility] detox treatment [facility] [place]. If you need to contact her call them. Thanks!

## 2016-12-20 NOTE — Progress Notes (Signed)
Patient denies any allergies to eggs or soy. Patient denies any problems with anesthesia/sedation. Patient denies any oxygen use at home and does not take any diet/weight loss medications. Patient states she is currently staying at [facility], she will not be able to reach via her phone number. Left message for [facility] for them to be aware and to call her at [facility] if needed before her EGD.

## 2016-12-21 ENCOUNTER — Encounter (HOSPITAL_COMMUNITY): Payer: Self-pay

## 2016-12-25 ENCOUNTER — Encounter (HOSPITAL_COMMUNITY): Payer: Self-pay | Admitting: Emergency Medicine

## 2016-12-25 ENCOUNTER — Encounter (HOSPITAL_COMMUNITY)
Admission: AD | Disposition: A | Discharge status: Home / Self Care | Payer: Self-pay | Source: Ambulatory Visit | Attending: Emergency Medicine

## 2016-12-25 ENCOUNTER — Inpatient Hospital Stay (HOSPITAL_COMMUNITY): Payer: 59

## 2016-12-25 ENCOUNTER — Inpatient Hospital Stay (HOSPITAL_COMMUNITY)
Admission: AD | Admit: 2016-12-25 | Discharge: 2016-12-29 | DRG: 919 | Disposition: A | Discharge status: Home / Self Care | Payer: 59 | Source: Ambulatory Visit | Attending: Internal Medicine | Admitting: Internal Medicine

## 2016-12-25 DIAGNOSIS — Z881 Allergy status to other antibiotic agents status: Secondary | ICD-10-CM

## 2016-12-25 DIAGNOSIS — K922 Gastrointestinal hemorrhage, unspecified: Secondary | ICD-10-CM | POA: Diagnosis present

## 2016-12-25 DIAGNOSIS — K7030 Alcoholic cirrhosis of liver without ascites: Secondary | ICD-10-CM | POA: Diagnosis present

## 2016-12-25 DIAGNOSIS — K7040 Alcoholic hepatic failure without coma: Secondary | ICD-10-CM | POA: Diagnosis present

## 2016-12-25 DIAGNOSIS — R578 Other shock: Secondary | ICD-10-CM | POA: Diagnosis not present

## 2016-12-25 DIAGNOSIS — R571 Hypovolemic shock: Secondary | ICD-10-CM | POA: Diagnosis not present

## 2016-12-25 DIAGNOSIS — K91840 Postprocedural hemorrhage and hematoma of a digestive system organ or structure following a digestive system procedure: Secondary | ICD-10-CM | POA: Diagnosis present

## 2016-12-25 DIAGNOSIS — I864 Gastric varices: Secondary | ICD-10-CM

## 2016-12-25 DIAGNOSIS — R188 Other ascites: Secondary | ICD-10-CM | POA: Diagnosis present

## 2016-12-25 DIAGNOSIS — K7031 Alcoholic cirrhosis of liver with ascites: Secondary | ICD-10-CM

## 2016-12-25 DIAGNOSIS — J9601 Acute respiratory failure with hypoxia: Secondary | ICD-10-CM | POA: Diagnosis present

## 2016-12-25 DIAGNOSIS — K2211 Ulcer of esophagus with bleeding: Secondary | ICD-10-CM | POA: Diagnosis present

## 2016-12-25 DIAGNOSIS — E876 Hypokalemia: Secondary | ICD-10-CM | POA: Diagnosis present

## 2016-12-25 DIAGNOSIS — K7290 Hepatic failure, unspecified without coma: Secondary | ICD-10-CM | POA: Diagnosis not present

## 2016-12-25 DIAGNOSIS — K7682 Hepatic encephalopathy: Secondary | ICD-10-CM

## 2016-12-25 DIAGNOSIS — Z91013 Allergy to seafood: Secondary | ICD-10-CM

## 2016-12-25 DIAGNOSIS — F17210 Nicotine dependence, cigarettes, uncomplicated: Secondary | ICD-10-CM | POA: Diagnosis present

## 2016-12-25 DIAGNOSIS — I8511 Secondary esophageal varices with bleeding: Secondary | ICD-10-CM | POA: Diagnosis present

## 2016-12-25 DIAGNOSIS — D62 Acute posthemorrhagic anemia: Secondary | ICD-10-CM | POA: Diagnosis not present

## 2016-12-25 DIAGNOSIS — D696 Thrombocytopenia, unspecified: Secondary | ICD-10-CM | POA: Diagnosis present

## 2016-12-25 DIAGNOSIS — Z79899 Other long term (current) drug therapy: Secondary | ICD-10-CM

## 2016-12-25 DIAGNOSIS — J9600 Acute respiratory failure, unspecified whether with hypoxia or hypercapnia: Secondary | ICD-10-CM

## 2016-12-25 DIAGNOSIS — J9602 Acute respiratory failure with hypercapnia: Secondary | ICD-10-CM | POA: Diagnosis not present

## 2016-12-25 HISTORY — PX: ESOPHAGOGASTRODUODENOSCOPY: SHX5428

## 2016-12-25 HISTORY — DX: Secondary esophageal varices with bleeding: I85.11

## 2016-12-25 LAB — CBC
HCT: 29.8 % — ABNORMAL LOW (ref 36.0–46.0)
HEMATOCRIT: 27.4 % — AB (ref 36.0–46.0)
HEMATOCRIT: 30.4 % — AB (ref 36.0–46.0)
HEMATOCRIT: 27 % — AB (ref 36.0–46.0)
HEMOGLOBIN: 9.1 g/dL — AB (ref 12.0–15.0)
HEMOGLOBIN: 10.2 g/dL — AB (ref 12.0–15.0)
HEMOGLOBIN: 9 g/dL — AB (ref 12.0–15.0)
Hemoglobin: 9.8 g/dL — ABNORMAL LOW (ref 12.0–15.0)
MCH: 30.3 pg (ref 26.0–34.0)
MCH: 30.1 pg (ref 26.0–34.0)
MCH: 30.8 pg (ref 26.0–34.0)
MCH: 30.6 pg (ref 26.0–34.0)
MCHC: 32.9 g/dL (ref 30.0–36.0)
MCHC: 33.2 g/dL (ref 30.0–36.0)
MCHC: 33.6 g/dL (ref 30.0–36.0)
MCHC: 33.3 g/dL (ref 30.0–36.0)
MCV: 93.1 fL (ref 78.0–100.0)
MCV: 90.7 fL (ref 78.0–100.0)
MCV: 91.8 fL (ref 78.0–100.0)
MCV: 91.8 fL (ref 78.0–100.0)
PLATELETS: 112 10*3/uL — AB (ref 150–400)
Platelets: 121 10*3/uL — ABNORMAL LOW (ref 150–400)
Platelets: 157 10*3/uL (ref 150–400)
Platelets: 100 10*3/uL — ABNORMAL LOW (ref 150–400)
RBC: 3.23 MIL/uL — AB (ref 3.87–5.11)
RBC: 3.02 MIL/uL — AB (ref 3.87–5.11)
RBC: 3.31 MIL/uL — AB (ref 3.87–5.11)
RBC: 2.94 MIL/uL — ABNORMAL LOW (ref 3.87–5.11)
RDW: 18.3 % — ABNORMAL HIGH (ref 11.5–15.5)
RDW: 18.7 % — ABNORMAL HIGH (ref 11.5–15.5)
RDW: 19.5 % — ABNORMAL HIGH (ref 11.5–15.5)
RDW: 19.6 % — ABNORMAL HIGH (ref 11.5–15.5)
WBC: 14.6 10*3/uL — AB (ref 4.0–10.5)
WBC: 15.4 10*3/uL — ABNORMAL HIGH (ref 4.0–10.5)
WBC: 22.3 10*3/uL — AB (ref 4.0–10.5)
WBC: 15.7 10*3/uL — ABNORMAL HIGH (ref 4.0–10.5)

## 2016-12-25 LAB — BLOOD GAS, ARTERIAL
Acid-base deficit: 7.3 mmol/L — ABNORMAL HIGH (ref 0.0–2.0)
Bicarbonate: 18.8 mmol/L — ABNORMAL LOW (ref 20.0–28.0)
DRAWN BY: 25413
FIO2: 0.6
MECHVT: 500 mL
O2 SAT: 95.4 %
PEEP: 5 cmH2O
PH ART: 7.24 — AB (ref 7.350–7.450)
Patient temperature: 98.6
RATE: 14 resp/min
pCO2 arterial: 45.4 mmHg (ref 32.0–48.0)
pO2, Arterial: 85.8 mmHg (ref 83.0–108.0)

## 2016-12-25 LAB — I-STAT TROPONIN, ED: Troponin i, poc: 0 ng/mL (ref 0.00–0.08)

## 2016-12-25 LAB — COMPREHENSIVE METABOLIC PANEL
ALBUMIN: 2.5 g/dL — AB (ref 3.5–5.0)
ALT: 28 U/L (ref 14–54)
ANION GAP: 5 (ref 5–15)
AST: 39 U/L (ref 15–41)
Alkaline Phosphatase: 76 U/L (ref 38–126)
BUN: 10 mg/dL (ref 6–20)
CO2: 22 mmol/L (ref 22–32)
Calcium: 8 mg/dL — ABNORMAL LOW (ref 8.9–10.3)
Chloride: 109 mmol/L (ref 101–111)
Creatinine, Ser: 0.68 mg/dL (ref 0.44–1.00)
GFR calc non Af Amer: >60 mL/min (ref >60)
Glucose, Bld: 220 mg/dL — ABNORMAL HIGH (ref 65–99)
Potassium: 4.1 mmol/L (ref 3.5–5.1)
SODIUM: 136 mmol/L (ref 135–145)
Total Bilirubin: 0.6 mg/dL (ref 0.3–1.2)
Total Protein: 5.4 g/dL — ABNORMAL LOW (ref 6.5–8.1)

## 2016-12-25 LAB — DIC (DISSEMINATED INTRAVASCULAR COAGULATION)PANEL
D-Dimer, Quant: 0.47 ug/mL-FEU (ref 0.00–0.50)
Fibrinogen: 269 mg/dL (ref 210–475)
Platelets: 101 10*3/uL — ABNORMAL LOW (ref 150–400)
Smear Review: NONE SEEN

## 2016-12-25 LAB — CBC WITH DIFFERENTIAL/PLATELET
Basophils Absolute: 0.1 10*3/uL (ref 0.0–0.1)
Basophils Relative: 1 %
EOS ABS: 0.4 10*3/uL (ref 0.0–0.7)
EOS PCT: 3 %
HCT: 20.4 % — ABNORMAL LOW (ref 36.0–46.0)
HEMOGLOBIN: 6.3 g/dL — AB (ref 12.0–15.0)
LYMPHS ABS: 1.8 10*3/uL (ref 0.7–4.0)
Lymphocytes Relative: 13 %
MCH: 33.3 pg (ref 26.0–34.0)
MCHC: 31.4 g/dL (ref 30.0–36.0)
MCV: 106.3 fL — ABNORMAL HIGH (ref 78.0–100.0)
Monocytes Absolute: 1.1 10*3/uL — ABNORMAL HIGH (ref 0.1–1.0)
Monocytes Relative: 8 %
NEUTROS PCT: 76 %
Neutro Abs: 10.8 10*3/uL — ABNORMAL HIGH (ref 1.7–7.7)
Platelets: 158 10*3/uL (ref 150–400)
RBC: 1.92 MIL/uL — AB (ref 3.87–5.11)
RDW: 15.8 % — ABNORMAL HIGH (ref 11.5–15.5)
WBC: 14.2 10*3/uL — AB (ref 4.0–10.5)

## 2016-12-25 LAB — PROTIME-INR
INR: 1.32
Prothrombin Time: 16.4 seconds — ABNORMAL HIGH (ref 11.4–15.2)

## 2016-12-25 LAB — MRSA PCR SCREENING: MRSA BY PCR: NEGATIVE

## 2016-12-25 LAB — DIC (DISSEMINATED INTRAVASCULAR COAGULATION) PANEL
APTT: 33 s (ref 24–36)
INR: 1.69
PROTHROMBIN TIME: 20.1 s — AB (ref 11.4–15.2)

## 2016-12-25 LAB — PREGNANCY, URINE: PREG TEST UR: NEGATIVE

## 2016-12-25 LAB — BLOOD PRODUCT ORDER (VERBAL) VERIFICATION

## 2016-12-25 LAB — GLUCOSE, CAPILLARY: Glucose-Capillary: 218 mg/dL — ABNORMAL HIGH (ref 65–99)

## 2016-12-25 LAB — PREPARE RBC (CROSSMATCH)

## 2016-12-25 LAB — HEMOGLOBIN AND HEMATOCRIT, BLOOD
HEMATOCRIT: 25.5 % — AB (ref 36.0–46.0)
Hemoglobin: 8.2 g/dL — ABNORMAL LOW (ref 12.0–15.0)

## 2016-12-25 LAB — MASSIVE TRANSFUSION PROTOCOL ORDER (BLOOD BANK NOTIFICATION)

## 2016-12-25 LAB — I-STAT CG4 LACTIC ACID, ED: LACTIC ACID, VENOUS: 1.83 mmol/L (ref 0.5–1.9)

## 2016-12-25 LAB — ABO/RH: ABO/RH(D): O POS

## 2016-12-25 SURGERY — EGD (ESOPHAGOGASTRODUODENOSCOPY)
Anesthesia: Moderate Sedation

## 2016-12-25 MED ORDER — DEXTROSE 5 % IV SOLN
1.0000 g | Freq: Once | INTRAVENOUS | Status: AC
  Administered 2016-12-25: 1 g via INTRAVENOUS
  Filled 2016-12-25: qty 10

## 2016-12-25 MED ORDER — TRANEXAMIC ACID 1000 MG/10ML IV SOLN: 500.0000 mg | Freq: Once | INTRAVENOUS | Status: DC

## 2016-12-25 MED ORDER — VITAMIN K1 10 MG/ML IJ SOLN
5.0000 mg | Freq: Once | INTRAVENOUS | Status: AC
  Administered 2016-12-25: 5 mg via INTRAVENOUS
  Filled 2016-12-25: qty 0.5

## 2016-12-25 MED ORDER — PNEUMOCOCCAL VAC POLYVALENT 25 MCG/0.5ML IJ INJ: 0.5000 mL | INJECTION | INTRAMUSCULAR | Status: DC | PRN

## 2016-12-25 MED ORDER — SODIUM CHLORIDE 0.9 % IV BOLUS (SEPSIS)
1000.0000 mL | Freq: Once | INTRAVENOUS | Status: AC
  Administered 2016-12-25: 1000 mL via INTRAVENOUS

## 2016-12-25 MED ORDER — PANTOPRAZOLE SODIUM 40 MG IV SOLR
40.0000 mg | Freq: Once | INTRAVENOUS | Status: AC
  Administered 2016-12-25: 40 mg via INTRAVENOUS
  Filled 2016-12-25: qty 40

## 2016-12-25 MED ORDER — PANTOPRAZOLE SODIUM 40 MG IV SOLR: 40.0000 mg | Freq: Two times a day (BID) | INTRAVENOUS | Status: DC

## 2016-12-25 MED ORDER — CHLORHEXIDINE GLUCONATE 0.12% ORAL RINSE (MEDLINE KIT)
15.0000 mL | Freq: Two times a day (BID) | OROMUCOSAL | Status: DC
  Administered 2016-12-25 (×2): 15 mL via OROMUCOSAL

## 2016-12-25 MED ORDER — MIDAZOLAM HCL 2 MG/2ML IJ SOLN: 2.0000 mg | INTRAMUSCULAR | Status: DC | PRN

## 2016-12-25 MED ORDER — OCTREOTIDE LOAD VIA INFUSION
50.0000 ug | Freq: Once | INTRAVENOUS | Status: AC
  Administered 2016-12-25: 50 ug via INTRAVENOUS
  Filled 2016-12-25: qty 25

## 2016-12-25 MED ORDER — SODIUM CHLORIDE 0.9 % IV SOLN
50.0000 ug/h | INTRAVENOUS | Status: DC
  Administered 2016-12-25 – 2016-12-26 (×5): 50 ug/h via INTRAVENOUS
  Filled 2016-12-25 (×8): qty 1

## 2016-12-25 MED ORDER — SODIUM CHLORIDE 0.9 % IV SOLN: 10.0000 mL/h | Freq: Once | INTRAVENOUS | Status: DC

## 2016-12-25 MED ORDER — FENTANYL 2500MCG IN NS 250ML (10MCG/ML) PREMIX INFUSION
25.0000 ug/h | INTRAVENOUS | Status: DC
  Administered 2016-12-25: 100 ug/h via INTRAVENOUS

## 2016-12-25 MED ORDER — ORAL CARE MOUTH RINSE
15.0000 mL | Freq: Four times a day (QID) | OROMUCOSAL | Status: DC
  Administered 2016-12-25 – 2016-12-26 (×4): 15 mL via OROMUCOSAL

## 2016-12-25 MED ORDER — DEXTROSE 5 % IV SOLN
1.0000 g | INTRAVENOUS | Status: AC
  Administered 2016-12-26 – 2016-12-29 (×4): 1 g via INTRAVENOUS
  Filled 2016-12-25 (×5): qty 10

## 2016-12-25 MED ORDER — FENTANYL CITRATE (PF) 100 MCG/2ML IJ SOLN
50.0000 ug | Freq: Once | INTRAMUSCULAR | Status: AC
  Administered 2016-12-25: 50 ug via INTRAVENOUS

## 2016-12-25 MED ORDER — FENTANYL 2500MCG IN NS 250ML (10MCG/ML) PREMIX INFUSION
10.0000 ug/h | INTRAVENOUS | Status: DC
  Administered 2016-12-25: 50 ug/h via INTRAVENOUS
  Filled 2016-12-25: qty 250

## 2016-12-25 MED ORDER — SODIUM CHLORIDE 0.9 % IV SOLN: Freq: Once | INTRAVENOUS | Status: DC

## 2016-12-25 MED ORDER — SODIUM CHLORIDE 0.9 % IJ SOLN
PREFILLED_SYRINGE | INTRAMUSCULAR | Status: DC | PRN
  Administered 2016-12-25: 4.5 mL

## 2016-12-25 MED ORDER — SODIUM CHLORIDE 0.9 % IV SOLN
8.0000 mg/h | INTRAVENOUS | Status: DC
  Administered 2016-12-25 – 2016-12-26 (×2): 8 mg/h via INTRAVENOUS
  Filled 2016-12-25 (×7): qty 80

## 2016-12-25 MED ORDER — SODIUM CHLORIDE 0.9 % IV SOLN
INTRAVENOUS | Status: DC
  Administered 2016-12-26 – 2016-12-28 (×5): via INTRAVENOUS

## 2016-12-25 MED ORDER — PROPOFOL 1000 MG/100ML IV EMUL
INTRAVENOUS | Status: AC
  Administered 2016-12-25: 10 ug/kg/min
  Filled 2016-12-25: qty 100

## 2016-12-25 MED ORDER — SODIUM CHLORIDE 0.9 % IV BOLUS (SEPSIS)
500.0000 mL | Freq: Once | INTRAVENOUS | Status: AC
  Administered 2016-12-25: 500 mL via INTRAVENOUS

## 2016-12-25 MED ORDER — EPINEPHRINE PF 1 MG/10ML IJ SOSY
PREFILLED_SYRINGE | INTRAMUSCULAR | Status: AC
  Filled 2016-12-25: qty 10

## 2016-12-25 MED ORDER — SODIUM CHLORIDE 0.9 % IV SOLN: 250.0000 mL | INTRAVENOUS | Status: DC | PRN

## 2016-12-25 MED ORDER — FENTANYL BOLUS VIA INFUSION
50.0000 ug | INTRAVENOUS | Status: DC | PRN
  Filled 2016-12-25: qty 50

## 2016-12-25 NOTE — Op Note (Signed)
[facility] Patient Name: Sabrina Maynard Procedure Date : 12/25/2016 MRN: [id number] Attending MD: Iva Boop , MD Date of Birth: 1975-09-28 CSN: [id number] Age: 41 Admit Type: Inpatient Procedure:                Upper GI endoscopy Indications:              Hematemesis Providers:                Iva Boop, MD, Dwain Sarna, RN, Margo Aye, Technician, Beryle Beams, Technician Referring MD:              Medicines:                Propofol per Anesthesia Complications:            No immediate complications. Estimated Blood Loss:     Estimated blood loss was minimal. Procedure:                Pre-Anesthesia Assessment:                           - Prior to the procedure, a History and Physical                            was performed, and patient medications and                            allergies were reviewed. The patient's tolerance of                            previous anesthesia was also reviewed. The risks                            and benefits of the procedure and the sedation                            options and risks were discussed with the patient.                            All questions were answered, and informed consent                            was obtained. Prior Anticoagulants: The patient has                            taken no previous anticoagulant or antiplatelet                            agents. ASA Grade Assessment: IV - A patient with                            severe systemic disease that is a constant threat  to life. After reviewing the risks and benefits,                            the patient was deemed in satisfactory condition to                            undergo the procedure.                           After obtaining informed consent, the endoscope was                            passed under direct vision. Throughout the                            procedure, the  patient's blood pressure, pulse, and                            oxygen saturations were monitored continuously. The                            [id number] ([id number]) scope was introduced through the                            mouth, and advanced to the body of the stomach. The                            upper GI endoscopy was accomplished without                            difficulty. The patient tolerated the procedure                            well. Scope In: Scope Out: Findings:      One cratered esophageal ulcer with oozing blood and stigmata of recent       bleeding - large adherent clot - was found in the distal esophagus. The       lesion was 10 mm in largest dimension. For hemostasis, six hemostatic       clips were successfully placed (MR conditional). There was no bleeding       at the end of the procedure. Area was successfully injected with 5 mL of       a 1:10,000 solution of epinephrine for hemostasis. Estimated blood loss       was minimal.      Red blood was found in the cardia, in the gastric fundus and in the       gastric body. Could not see wwll at all - given esophageal findings       elected to focus on treating that. Impression:               - Bleeding esophageal ulcer. Post banding ulcer                            with adherent clot Clips (MR conditional) were  placed. Injected.                           - Red blood in the cardia, in the gastric fundus                            and in the gastric body.                           - No specimens collected. Moderate Sedation:      moderate sedation not given ICU propofol infusion in intubated patient Recommendation:           - Return patient to ICU for ongoing care.                           - NPO.                           - Continue present medications.                           - Continue IV PPI infusion, octreotide and                            ceftriaxone                            would start rectal lactulose                           Will also admnister reglan x 4 doses                           Timing of repeat EGD depends upon clinical course -                            did not see sig varices now but could have been                            decompressed so suspect repeating in 4-6 weeks if                            not needed sooner makes sense Procedure Code(s):        --- Professional ---                           [phone], Esophagoscopy, flexible, transoral; with                            control of bleeding, any method Diagnosis Code(s):        --- Professional ---                           K22.11, Ulcer of esophagus with bleeding  K92.2, Gastrointestinal hemorrhage, unspecified                           K92.0, Hematemesis CPT copyright 2016 [employer]. All rights reserved. The codes documented in this report are preliminary and upon coder review may  be revised to meet current compliance requirements. Iva Boop, MD 12/25/2016 4:27:13 AM This report has been signed electronically. Number of Addenda: 0

## 2016-12-25 NOTE — ED Notes (Signed)
2nd unit of FFP started via emergency release [id number]

## 2016-12-25 NOTE — H&P (Signed)
PULMONARY / CRITICAL CARE MEDICINE   Name: Sabrina CrumbleLauren P Maynard MRN: [id number] DOB: Mar 03, 1976    ADMISSION DATE:  12/25/2016  CHIEF COMPLAINT:  hematemesis   HISTORY OF PRESENT ILLNESS:   Ms. Sabrina Maynard is a 41 y/o woman with a history of EtOH-related cirrhosis ([facility]) c/b esophogeal variceal bleed a few weeks ago s/p banding x6. She is currently in a rehab program at [facility] when she awoke to nausea and vomited blood. She presented to the ED where her hematemesis persisted and was found to be hypotensive and anemic. She did have BRBPR as well. She was volume resuscitated with 6 U PRBCs, 2U FFP, and 8 L of NS, with resolution of her hypotension. GI was consulted for urgent endoscopy. She was intubated in the ED for airway protection.  PAST MEDICAL HISTORY :  She  has a past medical history of Alcoholic cirrhosis (HCC); Alcoholism (HCC); Hypertension; Secondary esophageal varices with bleeding (HCC); and Thrombocytopenia (HCC).  PAST SURGICAL HISTORY: She  has a past surgical history that includes Esophagogastroduodenoscopy (egd) with propofol (N/A, 12/10/2016); Oophorectomy (Right); Uterine polyps removed; Appendectomy; and Cesarean section.  Allergies  Allergen Reactions  . Crab [Shellfish Allergy] Nausea And Vomiting  . Erythromycin Other (See Comments)    GI issues    No current facility-administered medications on file prior to encounter.    Current Outpatient Prescriptions on File Prior to Encounter  Medication Sig  . citalopram (CELEXA) 40 MG tablet Take 40 mg by mouth daily.   . nadolol (CORGARD) 20 MG tablet Take 10 mg by mouth daily.  . pantoprazole (PROTONIX) 40 MG tablet Take 1 tablet (40 mg total) by mouth 2 (two) times daily.  Marland Kitchen. thiamine 100 MG tablet Take 1 tablet (100 mg total) by mouth daily.  . folic acid (FOLVITE) 1 MG tablet Take 1 tablet (1 mg total) by mouth daily. (Patient not taking: Reported on 12/22/2016)    FAMILY HISTORY:   Her indicated that her mother is alive. She indicated that her father is deceased. She indicated that the status of her neg hx is unknown.    SOCIAL HISTORY: She  reports that she has been smoking Cigarettes.  She has been smoking about 0.50 packs per day. She has never used smokeless tobacco. She reports that she drinks alcohol. She reports that she does not use drugs.  VITAL SIGNS: BP 122/69   Pulse 74   Temp 98.6 F (37 C) (Oral)   Resp 13   Ht 5\' 4"  (1.626 m)   Wt 168 lb (76.2 kg)   LMP 12/02/2016 (Exact Date) Comment: neg preg test  SpO2 99%   BMI 28.84 kg/m   HEMODYNAMICS:    VENTILATOR SETTINGS: Vent Mode: PRVC FiO2 (%):  [100 %] 100 % Set Rate:  [14 bmp] 14 bmp Vt Set:  [500 mL] 500 mL PEEP:  [5 cmH20] 5 cmH20 Plateau Pressure:  [28 cmH20] 28 cmH20  INTAKE / OUTPUT: No intake/output data recorded.  PHYSICAL EXAMINATION: General:  Obese woman in moderate distress Neuro:  Awake, alert, oriented HEENT: Dried blood around mouth Cardiovascular:  Normal rate, hypotensive. Lungs:  Clear to auscultation Abdomen:  Distended and firm Musculoskeletal:  No deformities Skin:  Angiomata on neck noted  LABS:  BMET  Recent Labs Lab 12/25/16 0017  NA 136  K 4.1  CL 109  CO2 22  BUN 10  CREATININE 0.68  GLUCOSE 220*    Electrolytes  Recent Labs Lab 12/25/16 0017  CALCIUM 8.0*    CBC  Recent Labs Lab 12/25/16 0017 12/25/16 0139  WBC 14.2*  --   HGB 6.3* 8.2*  HCT 20.4* 25.5*  PLT 158 101*    Coag's  Recent Labs Lab 12/25/16 0017 12/25/16 0139  APTT  --  33  INR 1.32 1.69    Sepsis Markers  Recent Labs Lab 12/25/16 0027  LATICACIDVEN 1.83    ABG No results for input(s): PHART, PCO2ART, PO2ART in the last 168 hours.  Liver Enzymes  Recent Labs Lab 12/25/16 0017  AST 39  ALT 28  ALKPHOS 76  BILITOT 0.6  ALBUMIN 2.5*    Cardiac Enzymes No results for input(s): TROPONINI, PROBNP in the last 168  hours.  Glucose  Recent Labs Lab 12/25/16 0315  GLUCAP 218*    Imaging Dg Chest Portable 1 View  Result Date: 12/25/2016 CLINICAL DATA:  ET tube placement EXAM: PORTABLE CHEST 1 VIEW COMPARISON:  12/10/2016 FINDINGS: Endotracheal tube tip is approximately 2.1 cm superior to the carina. Low lung volumes. Increased opacity at the right lung base. No pleural effusion. Mild cardiomegaly. IMPRESSION: 1. Endotracheal tube tip about 2.1 cm superior to carina 2. Low lung volumes with right basilar atelectasis or infiltrate 3. Cardiomegaly Electronically Signed   By: Jasmine Pang M.D.   On: 12/25/2016 03:06    DISCUSSION: 41 y/o woman with UGIB, likely variceal, with hemorrhagic shock.  ASSESSMENT / PLAN:  PULMONARY A: Will require intubation for procedure and airway protection P:   Full vent support  CARDIOVASCULAR A:  Hypovolemic Shock P:  S/p volume resusictation with 6U PRBC, 8L IVF Improved hemodynamics  RENAL A:   No active issues;  P:   Is at risk for hyperchloremic metabolic acidosis.  GASTROINTESTINAL A:   EtOH related cirrhosis Acute UGIB d/t variceal bleed P:   GI to scope S/p volume resuscitation **update - large ulcer seen from prior banding, Sequelae of bleed, but had stopped). NO NGT, nothing by mouth for now**  HEMATOLOGIC A:   Hemorrhagic anemia, s/p resuscitation  P:  INR mildly elevated, fibrinogin WNL,   INFECTIOUS A:   S/p ceftriaxone P:   No other active issues  ENDOCRINE A:   No active issues  P:    NEUROLOGIC A:   Need for sedation P:   Fentanyl infusion and propfol RASS goal: -2  FAMILY  - Updates:   - Inter-disciplinary family meet or Palliative Care meeting due by:  01/01/17  CRITICAL CARE Performed by: Jamie Kato   Total critical care time: 85 minutes  Critical care time was exclusive of separately billable procedures and treating other patients.  Critical care was necessary to treat or prevent imminent or  life-threatening deterioration.  Critical care was time spent personally by me on the following activities: development of treatment plan with patient and/or surrogate as well as nursing, discussions with consultants, evaluation of patient's response to treatment, examination of patient, obtaining history from patient or surrogate, ordering and performing treatments and interventions, ordering and review of laboratory studies, ordering and review of radiographic studies, pulse oximetry and re-evaluation of patient's condition.  Jamie Kato, MD Pulmonary and Critical Care Medicine [facility] Pager: [phone]  12/25/2016, 3:48 AM

## 2016-12-25 NOTE — ED Notes (Signed)
4nd unit of packed RBC started via emergency release [id number]

## 2016-12-25 NOTE — ED Notes (Signed)
3rd unit of packed RBC started via emergency release [id number]

## 2016-12-25 NOTE — ED Notes (Signed)
2nd unit of packed RBC started via emergency release [id number]

## 2016-12-25 NOTE — ED Notes (Signed)
Per Dr. Corlis LeakMackuen initiate MTP

## 2016-12-25 NOTE — Procedures (Signed)
Extubation Procedure Note  Patient Details:   Name: Sabrina Maynard DOB: 1976/02/04 MRN: [id number]   Airway Documentation:     Evaluation  O2 sats: stable throughout Complications: No apparent complications Patient did tolerate procedure well. Bilateral Breath Sounds: Clear   Yes   Pt placed on [place] 4 Lpm with humidity, no stridor noted.  Pt able to reach 1500 using IS.  Rayburn FeltJean S Kamar Callender 12/25/2016, 12:33 PM

## 2016-12-25 NOTE — ED Notes (Signed)
30 Etomidate and 120 Succs

## 2016-12-25 NOTE — ED Notes (Signed)
EDP, RT, CCM, GI at bedside. Preparing to intubate.

## 2016-12-25 NOTE — ED Triage Notes (Signed)
Brought by ems from [facility].  Staff reports patient was walking down the hall when she had a syncopal episode and vomited blood.  Reports patient turned pale and initial bp on arrival of ems was 60/30.  Also c/o RLQ abdominal pain.  Hx of cirrhosis.  Recent admit for the esophageal varices.

## 2016-12-25 NOTE — ED Notes (Signed)
Liters 5&6 of NS infusing.

## 2016-12-25 NOTE — Progress Notes (Addendum)
PULMONARY / CRITICAL CARE MEDICINE   Name: MELONI HINZ MRN: [id number] DOB: 12/12/1975    ADMISSION DATE:  12/25/2016  CHIEF COMPLAINT:  hematemesis   HISTORY OF PRESENT ILLNESS:   Ms. Przybysz is a 41 y/o woman with a history of EtOH-related cirrhosis (followed at [facility] hepatology program) c/b esophogeal variceal bleed a few weeks ago s/p banding x6. She is currently in a rehab program at [facility] when she awoke to nausea and vomited blood. She presented to the ED where her hematemesis persisted and was found to be hypotensive and anemic. She did have BRBPR as well. She was volume resuscitated with 6 U PRBCs, 2U FFP, and 8 L of NS, with resolution of her hypotension. GI was consulted for urgent endoscopy. She was intubated in the ED for airway protection.  Subjective/interval events: Underwent EGD early this morning, showed a large ulcer in the esophagus at the location of prior banding with sequela of bleeding but no active bleeding. Currently tolerating pressure support and awake  VITAL SIGNS: BP (!) 90/54   Pulse 66   Temp 98 F (36.7 C) (Oral)   Resp 17   Ht 5\' 4"  (1.626 m)   Wt 85 kg (187 lb 6.3 oz)   LMP 12/02/2016 (Exact Date) Comment: neg preg test  SpO2 93%   BMI 32.17 kg/m   HEMODYNAMICS:    VENTILATOR SETTINGS: Vent Mode: CPAP;PSV FiO2 (%):  [40 %-100 %] 40 % Set Rate:  [14 bmp-20 bmp] 20 bmp Vt Set:  [440 mL-500 mL] 440 mL PEEP:  [5 cmH20] 5 cmH20 Pressure Support:  [5 cmH20] 5 cmH20 Plateau Pressure:  [21 cmH20-28 cmH20] 21 cmH20  INTAKE / OUTPUT: I/O last 3 completed shifts: In: 1360 [I.V.:1360] Out: 550 [Urine:550]  PHYSICAL EXAMINATION: General:  Obese woman, comfortable on mechanical ventilation Neuro:  Awake, alert, nods to questions, follows commands HEENT: ET tube in place Cardiovascular:  Regular, no murmur Lungs:  Clear bilaterally Abdomen:  Somewhat distended, obese Musculoskeletal:  No deformity Skin: No  rashes  LABS:  BMET  Recent Labs Lab 12/25/16 0017  NA 136  K 4.1  CL 109  CO2 22  BUN 10  CREATININE 0.68  GLUCOSE 220*    Electrolytes  Recent Labs Lab 12/25/16 0017  CALCIUM 8.0*    CBC  Recent Labs Lab 12/25/16 0017 12/25/16 0139 12/25/16 0546 12/25/16 1121  WBC 14.2*  --  14.6* 15.4*  HGB 6.3* 8.2* 9.8* 9.1*  HCT 20.4* 25.5* 29.8* 27.4*  PLT 158 101* 112* 121*    Coag's  Recent Labs Lab 12/25/16 0017 12/25/16 0139  APTT  --  33  INR 1.32 1.69    Sepsis Markers  Recent Labs Lab 12/25/16 0027  LATICACIDVEN 1.83    ABG  Recent Labs Lab 12/25/16 0415  PHART 7.240*  PCO2ART 45.4  PO2ART 85.8    Liver Enzymes  Recent Labs Lab 12/25/16 0017  AST 39  ALT 28  ALKPHOS 76  BILITOT 0.6  ALBUMIN 2.5*    Cardiac Enzymes No results for input(s): TROPONINI, PROBNP in the last 168 hours.  Glucose  Recent Labs Lab 12/25/16 0315  GLUCAP 218*   Abx:  Ceftriaxone 6/11 >>   Imaging Dg Chest Portable 1 View  Result Date: 12/25/2016 CLINICAL DATA:  ET tube placement EXAM: PORTABLE CHEST 1 VIEW COMPARISON:  12/10/2016 FINDINGS: Endotracheal tube tip is approximately 2.1 cm superior to the carina. Low lung volumes. Increased opacity at the right lung base. No pleural  effusion. Mild cardiomegaly. IMPRESSION: 1. Endotracheal tube tip about 2.1 cm superior to carina 2. Low lung volumes with right basilar atelectasis or infiltrate 3. Cardiomegaly Electronically Signed   By: Jasmine PangKim  Fujinaga M.D.   On: 12/25/2016 03:06    DISCUSSION: 41 y/o woman with UGIB, Esophageal ulceration at the location of a recent variceal band, with hemorrhagic shock, respiratory failure  ASSESSMENT / PLAN:  PULMONARY A: Acute respiratory failure P:   Tolerating pressure support. Plan to wean and extubated 6/11  CARDIOVASCULAR A:  Hypovolemic /hemorrhagic Shock, improved P:  Improved with blood transfusion, IV fluids Follow CBC for  stabilization  RENAL A:   Non-gap metabolic acidosis P:   Follow BMP, urine output  GASTROINTESTINAL A:   EtOH related cirrhosis Acute UGIB d/t variceal bleed, ulceration at prior varix banding P:   Status post EGD GI recommends absolutely no oral intake, no G-tube placement or manipulation of the esophagus until further notice Octreotide and protonix gtt's  HEMATOLOGIC A:   Hemorrhagic anemia, s/p resuscitation  Hepatic coagulopathy P:  Follow CBC Follow INR FFP if needed  INFECTIOUS A:   SBP prophylaxis P:   Ceftriaxone for 4 doses  ENDOCRINE A:   No active issues  P:    NEUROLOGIC A:   Acute encephalopathy on sedating medications At risk for hepatic encephalopathy P:   Discontinue sedating medications now in preparation for extubation Restart lactulose as soon as it is safe to give her anything by mouth  FAMILY  - Updates: Updated patient and husband at bedside on 6/11  - Inter-disciplinary family meet or Palliative Care meeting due by:  01/01/17   Independent CC time 35 minutes  Levy Pupaobert Coleen Cardiff, MD, PhD 12/25/2016, 1:02 PM [place] [facility] [phone] or if no answer [phone]

## 2016-12-25 NOTE — ED Notes (Signed)
All blood products done infusing, working on liter 3&4 of NS

## 2016-12-25 NOTE — ED Notes (Signed)
1st unit of FFP started via emergency release [id number]

## 2016-12-25 NOTE — ED Provider Notes (Signed)
[facility]-EMER[facility]ENCY DEPT Provider Note   CSN: [id number] Arrival date & time: 12/25/16  0003  By signing my name below, I, Diona Browner, attest that this documentation has been prepared under the direction and in the presence of Tylisha Danis, Cindee Salt, MD. Electronically Signed: Diona Browner, ED Scribe. 12/25/16. 12:22 AM.  History   Chief Complaint Chief Complaint  Patient presents with  . Hypotension  . Hematemesis    HPI Sabrina Maynard is a 41 y.o. female BIB EMS from [facility] with a PMHx of esophageal varices, cirrhosis and thrombocytopenia who presents to the Emergency Department complaining of sudden hematemesis that started PTA. Pt was walking down the hall when she had a syncopal episode and hematemesis. She vomited three times. Pt turned pale and initial BP on arrival was 60/30. Associated sx includes nausea and abdominal pain. She would like a breathing tube if needed. She had recent esophageal varices banding surgery done by Dr. Adela Lank on 12/10/16.  The history is provided by the patient. No language interpreter was used.    Past Medical History:  Diagnosis Date  . Alcoholic cirrhosis (HCC)   . Alcoholism (HCC)   . 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 Hypertension   . Thrombocytopenia [facility])     Patient Active Problem List   Diagnosis Date Noted  . Acute upper GI bleeding 12/10/2016  . UGI bleed 12/10/2016  . 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 Alcoholic cirrhosis (HCC)   . Alcoholism (HCC)   . Hypertension   . Thrombocytopenia (HCC)     Past Surgical History:  Procedure Laterality Date  . APPENDECTOMY    . CESAREAN SECTION     x1  . ESOPHAGOGASTRODUODENOSCOPY (EGD) WITH PROPOFOL N/A 12/10/2016   Procedure: ESOPHAGOGASTRODUODENOSCOPY (EGD) WITH PROPOFOL;  Surgeon: Ruffin Frederick, MD;  Location: [facility] ENDOSCOPY;  Service: Gastroenterology;  Laterality: N/A;  . OOPHORECTOMY Right   . Uterine polyps removed       OB History    No data available       Home Medications    Prior to Admission medications   Medication Sig Start Date End Date Taking? Authorizing Provider  citalopram (CELEXA) 40 MG tablet Take 40 mg by mouth daily.    Yes [provider]  diazepam (DIASTAT ACUDIAL) 10 MG GEL Place 10 mg rectally once. For seizure and call MD staff on call   Yes [provider]  ferrous sulfate 325 (65 FE) MG tablet Take 325 mg by mouth daily with breakfast.   Yes [provider]  lactulose (CHRONULAC) 10 GM/15ML solution Take 30 g by mouth daily.   Yes [provider]  Melatonin 3 MG TABS Take 3 mg by mouth at bedtime.   Yes [provider]  Multiple Vitamin (MULTIVITAMIN WITH MINERALS) TABS tablet Take 1 tablet by mouth daily.   Yes [provider]  nadolol (CORGARD) 20 MG tablet Take 10 mg by mouth daily.   Yes [provider]  pantoprazole (PROTONIX) 40 MG tablet Take 1 tablet (40 mg total) by mouth 2 (two) times daily. 12/14/16  Yes Randel Pigg, Dorma Russell, MD  protein supplement shake (PREMIER PROTEIN) LIQD Take by mouth 2 (two) times daily. 1 shake twice daily   Yes [provider]  Skin Protectants, Misc. (A+D FIRST AID) OINT Apply 1 application topically daily as needed (apply to affected area).   Yes [provider]  thiamine 100 MG tablet Take 1 tablet (100 mg total)  by mouth daily. 12/15/16  Yes Randel PiggSilva Zapata, Dorma RussellEdwin, MD  traZODone (DESYREL) 100 MG tablet Take 100 mg by mouth at bedtime.   Yes [provider]  WELLNESS PROTEIN SHAKE PO Take 1 each by mouth daily. Power crunch protein shake   Yes [provider]  folic acid (FOLVITE) 1 MG tablet Take 1 tablet (1 mg total) by mouth daily. Patient not taking: Reported on 12/22/2016 12/15/16   Lenox PondsSilva Zapata, Edwin, MD    Family History Family History  Problem Relation Age of Onset  . Colon cancer Neg Hx   . Esophageal cancer Neg Hx   . Stomach cancer Neg  Hx     Social History Social History  Substance Use Topics  . Smoking status: Current Every Day Smoker    Packs/day: 0.50    Types: Cigarettes  . Smokeless tobacco: Never Used  . Alcohol use Yes     Comment: last drank 12/04/16 is currently in detox treatment     Allergies   Crab [shellfish allergy] and Erythromycin   Review of Systems Review of Systems  Gastrointestinal: Positive for abdominal pain, nausea and vomiting.       Hematemesis.  Neurological: Positive for syncope.  All other systems reviewed and are negative.    Physical Exam Updated Vital Signs BP (!) 82/44   Pulse 81   Temp 98.2 F (36.8 C) (Oral)   Resp 17   Ht 5\' 4"  (1.626 m)   Wt 168 lb (76.2 kg)   LMP 12/02/2016 (Exact Date) Comment: neg preg test  SpO2 100%   BMI 28.84 kg/m   Physical Exam  Constitutional: She is oriented to person, place, and time. She appears well-developed and well-nourished.  HENT:  Head: Normocephalic.  Eyes: EOM are normal.  Neck: Normal range of motion.  Pulmonary/Chest: Effort normal.  Abdominal: She exhibits no distension. There is tenderness.  Musculoskeletal: Normal range of motion.  Neurological: She is alert and oriented to person, place, and time.  Psychiatric: She has a normal mood and affect.  Nursing note and vitals reviewed.    ED Treatments / Results  DIAGNOSTIC STUDIES: Oxygen Saturation is 100% on RA, normal by my interpretation.   COORDINATION OF CARE: 12:20 AM-Discussed next steps with pt. Pt verbalized understanding and is agreeable with the plan.   Labs (all labs ordered are listed, but only abnormal results are displayed) Labs Reviewed  CBC WITH DIFFERENTIAL/PLATELET  COMPREHENSIVE METABOLIC PANEL  PROTIME-INR  I-STAT CG4 LACTIC ACID, ED  I-STAT TROPOININ, ED  TYPE AND SCREEN    EKG  EKG Interpretation None       Radiology No results found.  Procedures Procedures (including critical care time)  Medications Ordered in  ED Medications  octreotide (SANDOSTATIN) 2 mcg/mL load via infusion 50 mcg (not administered)    And  octreotide (SANDOSTATIN) 500 mcg in sodium chloride 0.9 % 250 mL (2 mcg/mL) infusion (not administered)  cefTRIAXone (ROCEPHIN) 1 g in dextrose 5 % 50 mL IVPB (not administered)  pantoprazole (PROTONIX) injection 40 mg (not administered)  sodium chloride 0.9 % bolus 1,000 mL (1,000 mLs Intravenous New Bag/Given 12/25/16 0018)     Initial Impression / Assessment and Plan / ED Course  I have reviewed the triage vital signs and the nursing notes.  Pertinent labs & imaging results that were available during my care of the patient were reviewed by me and considered in my medical decision making (see chart for details).     Patient is a  42 year old female with history of gastric varices recently banded by Dr. Adela Lank. Patient had planned to do a additional EGD on Tuesday. Patient's had 3 episodes of vomiting blood prior to arrival. On arrival patient is pale, hypotensive. 2 L of saline hung. Patient then started to vomit more blood. Patient moved to the recess room.  GI called. Critical care called.  Critical care came to bedside and co-managed patient.  1:41 AM Updated GI. Pt is still hypotensive s/p 4 units of blood and 6 bags of fluid.  Massive transfusion protocol initiated.  1:55 AM 2 more units of blood and 2 more bags of fluid.  2:01 AM Intubating pt.   Pt intubated and will go upstairs for EGD.   Emergency Ultrasound:  US Guidance for needle guidance  Performed by Dr. Corlis Leak Indication: IV access Linear probe used in real-time to visualize location of needle entry through skin. Interpretation: IV inserted into peripheral vein. Images not archived electronically.   INTUBATION Performed by: Arlana Hove  Required items: required blood products, implants, devices, and special equipment available Patient identity confirmed: provided demographic data and  hospital-assigned identification number Time out: Immediately prior to procedure a "time out" was called to verify the correct patient, procedure, equipment, support staff and site/side marked as required.  Indications: Protect airway, need EGD  Intubation method:Glidescope Laryngoscopy   Preoxygenation: BVM  Sedatives: etomidate Paralytic:Succinylcholine  Tube Size: 7.5 cuffed  Post-procedure assessment: chest rise and ETCO2 monitor Breath sounds: equal and absent over the epigastrium Tube secured with: ETT holder Chest x-ray interpreted by radiologist and me.  Chest x-ray findings: endotracheal tube in appropriate position  Patient tolerated the procedure well with no immediate complications.      I personally performed the services described in this documentation, which was scribed in my presence. The recorded information has been reviewed and is accurate.    CRITICAL CARE Performed by: Arlana Hove Total critical care time: 200 minutes Critical care time was exclusive of separately billable procedures and treating other patients. Critical care was necessary to treat or prevent imminent or life-threatening deterioration. Critical care was time spent personally by me on the following activities: development of treatment plan with patient and/or surrogate as well as nursing, discussions with consultants, evaluation of patient's response to treatment, examination of patient, obtaining history from patient or surrogate, ordering and performing treatments and interventions, ordering and review of laboratory studies, ordering and review of radiographic studies, pulse oximetry and re-evaluation of patient's condition.   Final Clinical Impressions(s) / ED Diagnoses   Final diagnoses:  None    New Prescriptions New Prescriptions   No medications on file     Abelino Derrick, MD 12/28/16 [phone]

## 2016-12-25 NOTE — Progress Notes (Addendum)
     [facility] Progress Note  Chief Complaint:    GI bleed  Subjective: No complaints. Husband in room.   Objective:  Vital signs in last 24 hours: Temp:  [98 F (36.7 C)-98.6 F (37 C)] 98 F (36.7 C) (06/11 1139) Pulse Rate:  [66-90] 68 (06/11 1030) Resp:  [11-29] 20 (06/11 1030) BP: (80-144)/(41-94) 82/46 (06/11 1030) SpO2:  [92 %-100 %] 95 % (06/11 1030) FiO2 (%):  [100 %] 100 % (06/11 0356) Weight:  [168 lb (76.2 kg)-187 lb 6.3 oz (85 kg)] 187 lb 6.3 oz (85 kg) (06/11 0500)   General:   Alert, well-developed, white female in NAD EENT:  Normal hearing, non icteric sclera, conjunctive pink.  Heart:  Regular rate and rhythm, no lower extremity edema Pulm: Normal respiratory effort Abdomen:  Soft, nondistended, nontender.  Normal bowel sounds Neurologic:  Alert and  oriented x4;  grossly normal neurologically. Psych:  Alert and cooperative. Normal mood and affect.  Intake/Output from previous day: 06/10 0701 - 06/11 0700 In: 1360 [I.V.:1360] Out: 550 [Urine:550] Intake/Output this shift: Total I/O In: 105 [I.V.:105] Out: 225 [Urine:225]  Lab Results:  Recent Labs  12/25/16 0017 12/25/16 0139 12/25/16 0546 12/25/16 1121  WBC 14.2*  --  14.6* 15.4*  HGB 6.3* 8.2* 9.8* 9.1*  HCT 20.4* 25.5* 29.8* 27.4*  PLT 158 101* 112* 121*   BMET  Recent Labs  12/25/16 0017  NA 136  K 4.1  CL 109  CO2 22  GLUCOSE 220*  BUN 10  CREATININE 0.68  CALCIUM 8.0*   LFT  Recent Labs  12/25/16 0017  PROT 5.4*  ALBUMIN 2.5*  AST 39  ALT 28  ALKPHOS 76  BILITOT 0.6   PT/INR  Recent Labs  12/25/16 0017 12/25/16 0139  LABPROT 16.4* 20.1*  INR 1.32 1.69     Dg Chest Portable 1 View  Result Date: 12/25/2016 CLINICAL DATA:  ET tube placement EXAM: PORTABLE CHEST 1 VIEW COMPARISON:  12/10/2016 FINDINGS: Endotracheal tube tip is approximately 2.1 cm superior to the carina. Low lung volumes. Increased opacity at the right lung base. No pleural  effusion. Mild cardiomegaly. IMPRESSION: 1. Endotracheal tube tip about 2.1 cm superior to carina 2. Low lung volumes with right basilar atelectasis or infiltrate 3. Cardiomegaly Electronically Signed   By: Jasmine PangKim  Fujinaga M.D.   On: 12/25/2016 03:06    Assessment / Plan:  1. 41 yo female with ETOH cirrhosis [facility]. She was hospitalized late May with variceal bleeding, s/p banding. Readmitted during the night with recurrent bleed / shock. Emergent EGD at 2: 30 am revealed an oozing ulcer at site of previous varix banding. Thios was a complication from previous banding of varices. Ulcer was injected and 6 clips placed.  -hemodynamically stable now. She got 4 units of blood and 2 FFP -continue PPI ggt, octreotide and Rocephin.  -follow up EGD in 4-6 weeks.  2. ABL. She has recieved 4 units of PRBC and 2 units of FFP. Hgb 6.3 >>>9.1 now  3. ETOH abuse, currently in [facility].   Active Problems:   Bleeding gastric varices   Acute upper GI bleed   Acute esophageal ulcer with hemorrhage   LOS: 0 days   Willette ClusterPaula Guenther NP  12/25/2016, 12:01 PM  Pager number [phone]

## 2016-12-25 NOTE — ED Notes (Signed)
6th unit of packed RBC started via emergency release [id number]

## 2016-12-25 NOTE — ED Notes (Signed)
5th unit of packed RBC started via emergency release [id number]

## 2016-12-25 NOTE — Progress Notes (Signed)
eLink Physician-Brief Progress Note Patient Name: Sabrina Maynard DOB: 06-02-1976 MRN: [id number]   Date of Service  12/25/2016  HPI/Events of Note  Hem shock 6L fluid, 6U PRBC  eICU Interventions  Chk CBC q 6h NS bolus & 100/h May need pressors  dc propofol , use fent gtt     Intervention Category Major Interventions: Hemorrhage - evaluation and management;Hypotension - evaluation and management  ALVA,RAKESH V. 12/25/2016, 4:48 AM

## 2016-12-25 NOTE — Consult Note (Addendum)
Consultation  Referring Provider:     Critical care and Dr.  Valera Castle Gastroenterologist:        Clair Gulling, MD [facility] Reason for Consultation:     Hematemesis, cirrhosis     Impression / Plan:   Upper GI bleed - hematemesis in patient with alcoholic cirrhosis and s/p esophageal variceal banding 12/10/16. Life threatening scenario with hemorrhagic shock, acute blood loss anemia.  Suspect recurrent variceal bleeding vs post-banding ulcer bleeding - but peptic ulcer disease also possible.  Plan for emergency EGD this AM - possible variceal banding vs treatment of other types of bleeding. The risks and benefits as well as alternatives of endoscopic procedure(s) have been discussed and reviewed. All questions answered. The patient's husband agrees to proceed. She is intubated and sedated  Continue supportive care w/ octreotide, she got ceftriaxone prophylaxis  Iva Boop, MD, [facility]          HPI:   Sabrina Maynard is a 41 y.o. female with alcoholic cirrhosis currently in [facility], and s/p esophageal variceal bleeding in late may - Tx w/ 6 variceal bands by Dr. Adela Lank. At about midnight she had sudden onset of hematemesis x 3 - red - and was brought to ED where where had Hgb 6.7 and hypotension/shock w/ BP 60/30. aggressively resuscitated with large vol NS, 4 U blood and 2 U FFP. Was intubated. Normotensive now. Has had some epigastric pain. Vomited 1600 cc blood here in ED.   Past Medical History:  Diagnosis Date  . Alcoholic cirrhosis (HCC)   . Alcoholism (HCC)   . Hypertension   . Secondary esophageal varices with bleeding (HCC)   . Thrombocytopenia (HCC)     Past Surgical History:  Procedure Laterality Date  . APPENDECTOMY    . CESAREAN SECTION     x1  . ESOPHAGOGASTRODUODENOSCOPY (EGD) WITH PROPOFOL N/A 12/10/2016   Procedure: ESOPHAGOGASTRODUODENOSCOPY (EGD) WITH PROPOFOL;  Surgeon: Ruffin Frederick, MD;  Location: [facility]  ENDOSCOPY;  Service: Gastroenterology;  Laterality: N/A;  . OOPHORECTOMY Right   . Uterine polyps removed      Family History  Problem Relation Age of Onset  . Cirrhosis Father   . Colon cancer Neg Hx   . Esophageal cancer Neg Hx   . Stomach cancer Neg Hx     Social History  Substance Use Topics  . Smoking status: Current Every Day Smoker    Packs/day: 0.50    Types: Cigarettes  . Smokeless tobacco: Never Used  . Alcohol use Yes     Comment: last drank 12/04/16 is currently in detox treatment    Prior to Admission medications   Medication Sig Start Date End Date Taking? Authorizing Provider  citalopram (CELEXA) 40 MG tablet Take 40 mg by mouth daily.    Yes [provider]  diazepam (DIASTAT ACUDIAL) 10 MG GEL Place 10 mg rectally once. For seizure and call MD staff on call   Yes [provider]  ferrous sulfate 325 (65 FE) MG tablet Take 325 mg by mouth daily with breakfast.   Yes [provider]  lactulose (CHRONULAC) 10 GM/15ML solution Take 30 g by mouth daily.   Yes [provider]  Melatonin 3 MG TABS Take 3 mg by mouth at bedtime.   Yes [provider]  Multiple Vitamin (MULTIVITAMIN WITH MINERALS) TABS tablet Take 1 tablet by mouth daily.   Yes [provider]  nadolol (CORGARD) 20 MG tablet  Take 10 mg by mouth daily.   Yes [provider]  pantoprazole (PROTONIX) 40 MG tablet Take 1 tablet (40 mg total) by mouth 2 (two) times daily. 12/14/16  Yes Randel PiggSilva Zapata, Dorma RussellEdwin, MD  protein supplement shake (PREMIER PROTEIN) LIQD Take by mouth 2 (two) times daily. 1 shake twice daily   Yes [provider]  Skin Protectants, Misc. (A+D FIRST AID) OINT Apply 1 application topically daily as needed (apply to affected area).   Yes [provider]  thiamine 100 MG tablet Take 1 tablet (100 mg total) by mouth daily. 12/15/16  Yes Randel PiggSilva Zapata, Dorma RussellEdwin, MD  traZODone (DESYREL) 100 MG tablet Take 100 mg by mouth at  bedtime.   Yes [provider]  WELLNESS PROTEIN SHAKE PO Take 1 each by mouth daily. Power crunch protein shake   Yes [provider]  folic acid (FOLVITE) 1 MG tablet Take 1 tablet (1 mg total) by mouth daily. Patient not taking: Reported on 12/22/2016 12/15/16   Lenox PondsSilva Zapata, Edwin, MD    Current Facility-Administered Medications  Medication Dose Route Frequency Provider Last Rate Last Dose  . 0.9 %  sodium chloride infusion  10 mL/hr Intravenous Once Mackuen, Courteney Lyn, MD      . 0.9 %  sodium chloride infusion  10 mL/hr Intravenous Once Mackuen, Courteney Lyn, MD      . 0.9 %  sodium chloride infusion   Intravenous Once Mackuen, Courteney Lyn, MD      . 0.9 %  sodium chloride infusion  250 mL Intravenous PRN Jamie Katorimble, Aaron, MD      . fentaNYL 2500mcg in NS 250mL (10310mcg/ml) infusion-PREMIX  10 mcg/hr Intravenous Continuous Jamie Katorimble, Aaron, MD 3 mL/hr at 12/25/16 0222 30 mcg/hr at 12/25/16 0222  . octreotide (SANDOSTATIN) 500 mcg in sodium chloride 0.9 % 250 mL (2 mcg/mL) infusion  50 mcg/hr Intravenous Continuous Mackuen, Courteney Lyn, MD 25 mL/hr at 12/25/16 0048 50 mcg/hr at 12/25/16 0048   Current Outpatient Prescriptions  Medication Sig Dispense Refill  . citalopram (CELEXA) 40 MG tablet Take 40 mg by mouth daily.     . diazepam (DIASTAT ACUDIAL) 10 MG GEL Place 10 mg rectally once. For seizure and call MD staff on call    . ferrous sulfate 325 (65 FE) MG tablet Take 325 mg by mouth daily with breakfast.    . lactulose (CHRONULAC) 10 GM/15ML solution Take 30 g by mouth daily.    . Melatonin 3 MG TABS Take 3 mg by mouth at bedtime.    . Multiple Vitamin (MULTIVITAMIN WITH MINERALS) TABS tablet Take 1 tablet by mouth daily.    . nadolol (CORGARD) 20 MG tablet Take 10 mg by mouth daily.    . pantoprazole (PROTONIX) 40 MG tablet Take 1 tablet (40 mg total) by mouth 2 (two) times daily. 60 tablet 0  . protein supplement shake (PREMIER PROTEIN) LIQD Take by mouth 2 (two)  times daily. 1 shake twice daily    . Skin Protectants, Misc. (A+D FIRST AID) OINT Apply 1 application topically daily as needed (apply to affected area).    . thiamine 100 MG tablet Take 1 tablet (100 mg total) by mouth daily. 30 tablet 0  . traZODone (DESYREL) 100 MG tablet Take 100 mg by mouth at bedtime.    Joan Flores. WELLNESS PROTEIN SHAKE PO Take 1 each by mouth daily. Power crunch protein shake    . folic acid (FOLVITE) 1 MG tablet Take 1 tablet (1 mg total) by  mouth daily. (Patient not taking: Reported on 12/22/2016) 30 tablet 0    Allergies as of 12/15/2016 - Review Complete 12/10/2016  Allergen Reaction Noted  . Crab [shellfish allergy] Nausea And Vomiting 12/10/2016  . Erythromycin  12/10/2016     Review of Systems:    This is positive for those things mentioned in the HPI, limited ability to gather further due to illness and intubation    Physical Exam:  Vital signs in last 24 hours: Temp:  [98.2 F (36.8 C)] 98.2 F (36.8 C) (06/11 0009) Pulse Rate:  [74-90] 80 (06/11 0220) Resp:  [11-27] 23 (06/11 0220) BP: (82-116)/(44-81) 112/74 (06/11 0220) SpO2:  [95 %-100 %] 99 % (06/11 0220) FiO2 (%):  [100 %] 100 % (06/11 0213) Weight:  [168 lb (76.2 kg)] 168 lb (76.2 kg) (06/11 0010)    General:  Acutely and critically ill Eyes:  anicteric. ENT:   ET tube in place Neck:   supple w/o thyromegaly or mass.  Lungs: Clear to auscultation bilaterally. Heart:  S1S2, no rubs, murmurs, gallops. Abdomen:  distended soft when in inspiration, mildly tender epigastrium?, BS + Lymph:  no cervical or supraclavicular adenopathy. Extremities:   no edema Skin   multiple spider angiomata upper trunk. Neuro:  Sedated, intubated     Data Reviewed:   LAB RESULTS:  Recent Labs  12/25/16 0017 12/25/16 0139  WBC 14.2*  --   HGB 6.3* 8.2*  HCT 20.4* 25.5*  PLT 158 101*   BMET  Recent Labs  12/25/16 0017  NA 136  K 4.1  CL 109  CO2 22  GLUCOSE 220*  BUN 10  CREATININE 0.68    CALCIUM 8.0*   LFT  Recent Labs  12/25/16 0017  PROT 5.4*  ALBUMIN 2.5*  AST 39  ALT 28  ALKPHOS 76  BILITOT 0.6   PT/INR  Recent Labs  12/25/16 0017 12/25/16 0139  LABPROT 16.4* 20.1*  INR 1.32 1.69    Also reviewed last hospitalization notes, EGD and photos/images, [facility] notes in care everywhere,      Thanks   LOS: 0 days   @Jonaya Freshour  Sena Slate, MD, [facility] @  12/25/2016, 2:40 AM

## 2016-12-25 NOTE — ED Notes (Signed)
Paged Crit Care 

## 2016-12-25 NOTE — ED Notes (Signed)
1st unit packed RBC started via emergent release [id number]

## 2016-12-26 ENCOUNTER — Encounter (HOSPITAL_COMMUNITY)
Admission: AD | Disposition: A | Discharge status: Home / Self Care | Payer: Self-pay | Source: Ambulatory Visit | Attending: Emergency Medicine

## 2016-12-26 ENCOUNTER — Encounter (HOSPITAL_COMMUNITY): Payer: Self-pay | Admitting: Internal Medicine

## 2016-12-26 LAB — BPAM FFP
BLOOD PRODUCT EXPIRATION DATE: 201806132359
BLOOD PRODUCT EXPIRATION DATE: 201806162359
Blood Product Expiration Date: 201806132359
Blood Product Expiration Date: 201806162359
Blood Product Expiration Date: 201806162359
Blood Product Expiration Date: 201806162359
ISSUE DATE / TIME: 201806110100
ISSUE DATE / TIME: 201806110100
UNIT TYPE AND RH: 5100
Unit Type and Rh: 5100
Unit Type and Rh: 5100
Unit Type and Rh: 5100
Unit Type and Rh: 5100
Unit Type and Rh: 9500

## 2016-12-26 LAB — BASIC METABOLIC PANEL
ANION GAP: 5 (ref 5–15)
BUN: 15 mg/dL (ref 6–20)
CALCIUM: 7.8 mg/dL — AB (ref 8.9–10.3)
CO2: 25 mmol/L (ref 22–32)
CREATININE: 0.44 mg/dL (ref 0.44–1.00)
Chloride: 112 mmol/L — ABNORMAL HIGH (ref 101–111)
GFR calc Af Amer: >60 mL/min (ref >60)
GLUCOSE: 116 mg/dL — AB (ref 65–99)
Potassium: 3.9 mmol/L (ref 3.5–5.1)
Sodium: 142 mmol/L (ref 135–145)

## 2016-12-26 LAB — PREPARE FRESH FROZEN PLASMA
UNIT DIVISION: 0
UNIT DIVISION: 0
Unit division: 0
Unit division: 0
Unit division: 0
Unit division: 0

## 2016-12-26 LAB — CBC
HEMATOCRIT: 27 % — AB (ref 36.0–46.0)
HEMATOCRIT: 27.6 % — AB (ref 36.0–46.0)
Hemoglobin: 8.7 g/dL — ABNORMAL LOW (ref 12.0–15.0)
Hemoglobin: 9.1 g/dL — ABNORMAL LOW (ref 12.0–15.0)
MCH: 29.9 pg (ref 26.0–34.0)
MCH: 30.4 pg (ref 26.0–34.0)
MCHC: 32.2 g/dL (ref 30.0–36.0)
MCHC: 33 g/dL (ref 30.0–36.0)
MCV: 92.8 fL (ref 78.0–100.0)
MCV: 92.3 fL (ref 78.0–100.0)
PLATELETS: 96 10*3/uL — AB (ref 150–400)
PLATELETS: 98 10*3/uL — AB (ref 150–400)
RBC: 2.91 MIL/uL — ABNORMAL LOW (ref 3.87–5.11)
RBC: 2.99 MIL/uL — ABNORMAL LOW (ref 3.87–5.11)
RDW: 19.7 % — AB (ref 11.5–15.5)
RDW: 19.6 % — AB (ref 11.5–15.5)
WBC: 12.2 10*3/uL — AB (ref 4.0–10.5)
WBC: 12.9 10*3/uL — AB (ref 4.0–10.5)

## 2016-12-26 LAB — HEMOGLOBIN AND HEMATOCRIT, BLOOD
HCT: 28 % — ABNORMAL LOW (ref 36.0–46.0)
Hemoglobin: 9.2 g/dL — ABNORMAL LOW (ref 12.0–15.0)

## 2016-12-26 LAB — PROTIME-INR
INR: 1.2
Prothrombin Time: 15.3 seconds — ABNORMAL HIGH (ref 11.4–15.2)

## 2016-12-26 SURGERY — ESOPHAGOSCOPY, WITH ESOPHAGEAL VARICES BAND LIGATION
Anesthesia: Monitor Anesthesia Care

## 2016-12-26 MED ORDER — PANTOPRAZOLE SODIUM 40 MG PO TBEC
40.0000 mg | DELAYED_RELEASE_TABLET | Freq: Two times a day (BID) | ORAL | Status: DC
  Administered 2016-12-26 – 2016-12-29 (×6): 40 mg via ORAL
  Filled 2016-12-26 (×6): qty 1

## 2016-12-26 MED ORDER — VITAMIN B-1 100 MG PO TABS
100.0000 mg | ORAL_TABLET | Freq: Every day | ORAL | Status: DC
  Administered 2016-12-26 – 2016-12-29 (×4): 100 mg via ORAL
  Filled 2016-12-26 (×4): qty 1

## 2016-12-26 MED ORDER — NADOLOL 20 MG PO TABS
10.0000 mg | ORAL_TABLET | Freq: Every day | ORAL | Status: DC
  Administered 2016-12-26 – 2016-12-29 (×4): 10 mg via ORAL
  Filled 2016-12-26 (×7): qty 1

## 2016-12-26 MED ORDER — TRAZODONE HCL 50 MG PO TABS
100.0000 mg | ORAL_TABLET | Freq: Every day | ORAL | Status: DC
  Administered 2016-12-26: 100 mg via ORAL
  Filled 2016-12-26: qty 1

## 2016-12-26 MED ORDER — CITALOPRAM HYDROBROMIDE 40 MG PO TABS
40.0000 mg | ORAL_TABLET | Freq: Every day | ORAL | Status: DC
  Administered 2016-12-26 – 2016-12-29 (×4): 40 mg via ORAL
  Filled 2016-12-26: qty 1
  Filled 2016-12-26: qty 2
  Filled 2016-12-26: qty 1
  Filled 2016-12-26: qty 2

## 2016-12-26 MED ORDER — LACTULOSE 10 GM/15ML PO SOLN
30.0000 g | Freq: Every day | ORAL | Status: DC
Start: 2016-12-26 — End: 2016-12-27
  Administered 2016-12-26 – 2016-12-27 (×2): 30 g via ORAL
  Filled 2016-12-26 (×2): qty 45

## 2016-12-26 MED ORDER — FENTANYL CITRATE (PF) 100 MCG/2ML IJ SOLN: 12.5000 ug | INTRAMUSCULAR | Status: DC | PRN

## 2016-12-26 MED ORDER — FERROUS SULFATE 325 (65 FE) MG PO TABS
325.0000 mg | ORAL_TABLET | Freq: Every day | ORAL | Status: DC
  Administered 2016-12-27 – 2016-12-29 (×3): 325 mg via ORAL
  Filled 2016-12-26 (×4): qty 1

## 2016-12-26 MED ORDER — DIPHENHYDRAMINE HCL 50 MG/ML IJ SOLN
25.0000 mg | Freq: Once | INTRAMUSCULAR | Status: AC
  Administered 2016-12-26: 25 mg via INTRAVENOUS
  Filled 2016-12-26: qty 1

## 2016-12-26 NOTE — Progress Notes (Signed)
PULMONARY / CRITICAL CARE MEDICINE   Name: Sabrina Maynard MRN: [id number] DOB: 20-Sep-1975    ADMISSION DATE:  12/25/2016  CHIEF COMPLAINT:  hematemesis   HISTORY OF PRESENT ILLNESS:   Ms. Sabrina Maynard is a 41 y/o woman with a history of EtOH-related cirrhosis ([facility]) c/b esophogeal variceal bleed a few weeks ago s/p banding x6. She is currently in a re[facility] when she awoke to nausea and vomited blood. She presented to the ED where her hematemesis persisted and was found to be hypotensive and anemic. She did have BRBPR as well. She was volume resuscitated with 6 U PRBCs, 2U FFP, and 8 L of NS, with resolution of her hypotension. GI was consulted for urgent endoscopy. She was intubated in the ED for airway protection.  Subjective/interval events: No acute events, tolerated extubation well.  VITAL SIGNS: BP 113/85   Pulse 72   Temp 97.9 F (36.6 C) (Oral)   Resp 13   Ht 5\' 4"  (1.626 m)   Wt 84.5 kg (186 lb 4.6 oz)   LMP 12/02/2016 (Exact Date) Comment: neg preg test  SpO2 95%   BMI 31.98 kg/m   HEMODYNAMICS:    VENTILATOR SETTINGS: Vent Mode: CPAP;PSV FiO2 (%):  [40 %] 40 % PEEP:  [5 cmH20] 5 cmH20 Pressure Support:  [5 cmH20] 5 cmH20  INTAKE / OUTPUT: I/O last 3 completed shifts: In: 2509 [I.V.:2459; IV Piggyback:50] Out: 2850 [Urine:2850]  PHYSICAL EXAMINATION: General: Young female, visiting with family. Neuro: A&O x 3, no deficits. HEENT: Hodgkins/AT, PERRL. Cardiovascular: RRR, no M/R/G. Lungs: Clear bilaterally. Abdomen: BS x 4, S/NT/ND. Musculoskeletal: No deformities, no edema. Skin: Warm, dry.  LABS:  BMET  Recent Labs Lab 12/25/16 0017 12/26/16 0512  NA 136 142  K 4.1 3.9  CL 109 112*  CO2 22 25  BUN 10 15  CREATININE 0.68 0.44  GLUCOSE 220* 116*    Electrolytes  Recent Labs Lab 12/25/16 0017 12/26/16 0512  CALCIUM 8.0* 7.8*    CBC  Recent Labs Lab 12/25/16 1705 12/25/16 2238  12/26/16 0512  WBC 22.3* 15.7* 12.2*  HGB 10.2* 9.0* 8.7*  HCT 30.4* 27.0* 27.0*  PLT 157 100* 96*    Coag's  Recent Labs Lab 12/25/16 0017 12/25/16 0139 12/26/16 0512  APTT  --  33  --   INR 1.32 1.69 1.20    Sepsis Markers  Recent Labs Lab 12/25/16 0027  LATICACIDVEN 1.83    ABG  Recent Labs Lab 12/25/16 0415  PHART 7.240*  PCO2ART 45.4  PO2ART 85.8    Liver Enzymes  Recent Labs Lab 12/25/16 0017  AST 39  ALT 28  ALKPHOS 76  BILITOT 0.6  ALBUMIN 2.5*    Cardiac Enzymes No results for input(s): TROPONINI, PROBNP in the last 168 hours.  Glucose  Recent Labs Lab 12/25/16 0315  GLUCAP 218*   Abx:  Ceftriaxone 6/11 >>   Imaging No results found.  DISCUSSION: 41 y/o woman with UGIB, Esophageal ulceration at the location of a recent variceal band, with hemorrhagic shock, respiratory failure  ASSESSMENT / PLAN:  PULMONARY A: Acute respiratory failure - s/p intubation.  Extubated 6/11 and tolerated well. P:   Pulmonary hygiene.  CARDIOVASCULAR A:  Hypovolemic / hemorrhagic shock - resolved 6/12. P:  Monitor hemodynamics.  RENAL A:   No acute issues. P:   Monitor electrolytes, correct as indicated. BMP in AM.  GASTROINTESTINAL A:   EtOH related cirrhosis - [facility]. Acute UGIB d/t variceal bleed - s/p emergent EGD 6/11 which revealed ulceration at prior varix banding  > injected with epi and clipped x 6. P:   GI recommends absolutely no oral intake, no G-tube placement or manipulation of the esophagus until further notice. Continue Octreotide. D/c protonix gtt. F/u with [facility] post discharge (already followed by them).  HEMATOLOGIC A:   Hemorrhagic anemia, s/p resuscitation. Hepatic coagulopathy. P:  Follow CBC, INR. Transfuse for Hgb < 7. FFP if needed.  INFECTIOUS A:   SBP prophylaxis P:   Ceftriaxone for 4 doses, currently on day 2 (stop date 6/14).  ENDOCRINE A:   No active  issues  P:   No interventions required.  NEUROLOGIC A:   At risk for hepatic encephalopathy P:   Restart lactulose as soon as it is safe to give her anything by mouth.  Stable for transfer out of ICU.  Will transfer to SDU and ask TRH to assume care starting in AM 6/13.  FAMILY  - Updates: Updated patient and husband at bedside on 6/11 and 6/12.  - Inter-disciplinary family meet or Palliative Care meeting due by:  01/01/17  CC time: 30 min.   Rutherford Guys, [place] [facility] [place] [facility] Pager: [phone]  or [phone] 12/26/2016, 12:39 PM  Attending Note:  I have examined patient, reviewed labs, studies and notes. I have discussed the case with Ihor Dow, and I agree with the data and plans as amended above. 41 year old woman with alcoholic cirrhosis and known varices status post recent banding. She was admitted with massive hematemesis and upper GI bleed requiring intubation and then urgent EGD. She was found to have a bleeding ulcerated esophageal lesion at the site of one of her bands. This was clipped. She received transfusions and stabilized. She was extubated on 6/11. Her hemoglobin has stabilized, last 8.7. On evaluation she is awake, comfortable, appropriate. Abdomen with some slight tenderness to palpation. Positive bowel sounds. Heart regular without a murmur. Lungs are clear bilaterally. We will plan to follow her CBC. Keep her nothing by mouth until okay with gastroenterology to start diet. An NG tube will not be placed given the risk of bleeding. Restart lactulose when it's okay for her to take by mouth. Complete ceftriaxone, 4 doses total. She is understandably concerned that she return to alcohol rehabilitation. Based on my discussions with case management does not sound like she will be able to return to [facility]. We will ask social work to assist [place] with next steps in her rehabilitation.   Transfer to SDU bed on 6/12, to [facility] as of  6/13  Levy Pupa, MD, PhD 12/26/2016, 2:07 PM  Pulmonary and Critical Care [phone] or if no answer [phone]

## 2016-12-26 NOTE — Progress Notes (Signed)
Patient had bloody liquid bowel movement. Unsure of amount as it was mixed with urine as well. Patient has not had a bowel movement this admission. Blood pressure stable. Hgb drawn at 1914 WNL. eLink made aware. No new orders at this time.

## 2016-12-26 NOTE — Progress Notes (Addendum)
   Patient Name: Sabrina Maynard Date of Encounter: 12/26/2016, 5:04 PM    Subjective  Extubated Hungry No bleeding   Objective  BP (!) 107/57   Pulse 72   Temp 98.8 F (37.1 C) (Oral)   Resp (!) 21   Ht 5\' 4"  (1.626 m)   Wt 186 lb 4.6 oz (84.5 kg)   LMP 12/02/2016 (Exact Date) Comment: neg preg test  SpO2 94%   BMI 31.98 kg/m  Alert and oriented x 3 NAD  CBC Latest Ref Rng & Units 12/26/2016 12/26/2016 12/25/2016  WBC 4.0 - 10.5 K/uL 12.9(H) 12.2(H) 15.7(H)  Hemoglobin 12.0 - 15.0 g/dL 1.6(X9.1(L) 0.9(U8.7(L) 0.4(V9.0(L)  Hematocrit 36.0 - 46.0 % 27.6(L) 27.0(L) 27.0(L)  Platelets 150 - 400 K/uL 98(L) 96(L) 100(L)      Assessment and Plan  Acute GI bleed from post-banding ulcer - impropved - stopped  Acute blood loss anemia - improved  Alcoholic liver disease - stable  Agree w/ moving to floor Full liq diet Resume home meds Oral PPI DC octreotide Seems to me it would be ok to be back in [facility] as no specific new care will be needed because of this bleeding problem - if she were to bleed again would call 911 again  Iva Booparl E. Onnika Siebel, MD, [facility] [place] [facility] [phone] (pager) [phone] after 5 PM, weekends and holidays  12/26/2016 5:06 PM    Iva Booparl E. Nechemia Chiappetta, MD, [facility]  Gastroenterology [phone] (pager) [phone] after 5 PM, weekends and holidays  12/26/2016 5:04 PM

## 2016-12-27 DIAGNOSIS — K7290 Hepatic failure, unspecified without coma: Secondary | ICD-10-CM

## 2016-12-27 LAB — BASIC METABOLIC PANEL
Anion gap: 10 (ref 5–15)
BUN: 9 mg/dL (ref 6–20)
CHLORIDE: 108 mmol/L (ref 101–111)
CO2: 23 mmol/L (ref 22–32)
CREATININE: 0.46 mg/dL (ref 0.44–1.00)
Calcium: 7.9 mg/dL — ABNORMAL LOW (ref 8.9–10.3)
GFR calc Af Amer: >60 mL/min (ref >60)
GLUCOSE: 126 mg/dL — AB (ref 65–99)
POTASSIUM: 3.4 mmol/L — AB (ref 3.5–5.1)
Sodium: 141 mmol/L (ref 135–145)

## 2016-12-27 LAB — CBC
HCT: 26.6 % — ABNORMAL LOW (ref 36.0–46.0)
Hemoglobin: 8.7 g/dL — ABNORMAL LOW (ref 12.0–15.0)
MCH: 30.4 pg (ref 26.0–34.0)
MCHC: 32.7 g/dL (ref 30.0–36.0)
MCV: 93 fL (ref 78.0–100.0)
PLATELETS: 94 10*3/uL — AB (ref 150–400)
RBC: 2.86 MIL/uL — AB (ref 3.87–5.11)
RDW: 19.6 % — ABNORMAL HIGH (ref 11.5–15.5)
WBC: 9 10*3/uL (ref 4.0–10.5)

## 2016-12-27 LAB — AMMONIA: Ammonia: 172 umol/L — ABNORMAL HIGH (ref 9–35)

## 2016-12-27 LAB — PHOSPHORUS: Phosphorus: 3.7 mg/dL (ref 2.5–4.6)

## 2016-12-27 LAB — MAGNESIUM: Magnesium: 1.6 mg/dL — ABNORMAL LOW (ref 1.7–2.4)

## 2016-12-27 MED ORDER — POTASSIUM CHLORIDE CRYS ER 20 MEQ PO TBCR
40.0000 meq | EXTENDED_RELEASE_TABLET | Freq: Once | ORAL | Status: AC
  Administered 2016-12-27: 40 meq via ORAL
  Filled 2016-12-27: qty 2

## 2016-12-27 MED ORDER — MAGNESIUM SULFATE 2 GM/50ML IV SOLN
2.0000 g | Freq: Once | INTRAVENOUS | Status: AC
  Administered 2016-12-27: 2 g via INTRAVENOUS
  Filled 2016-12-27: qty 50

## 2016-12-27 MED ORDER — LACTULOSE 10 GM/15ML PO SOLN
30.0000 g | Freq: Four times a day (QID) | ORAL | Status: DC
  Administered 2016-12-27 – 2016-12-28 (×4): 30 g via ORAL
  Filled 2016-12-27 (×4): qty 45

## 2016-12-27 MED ORDER — RIFAXIMIN 550 MG PO TABS
550.0000 mg | ORAL_TABLET | Freq: Two times a day (BID) | ORAL | Status: DC
  Administered 2016-12-27 – 2016-12-29 (×5): 550 mg via ORAL
  Filled 2016-12-27 (×5): qty 1

## 2016-12-27 NOTE — Progress Notes (Addendum)
Daily Rounding Note  12/27/2016, 8:33 AM  LOS: 2 days   SUBJECTIVE:   Chief complaint:  Confusion.  Pt's spouse thinks it is from Trazodone given to her last night just before transfer to SDU.   This same med is on PTA med list.     Denies abd pain and N/V.  1 BM overnight, was black/dark.   Tolerating full liquids.  OBJECTIVE:         Vital signs in last 24 hours:    Temp:  [97.3 F (36.3 C)-99 F (37.2 C)] 98.8 F (37.1 C) (06/13 0555) Pulse Rate:  [69-86] 73 (06/13 0555) Resp:  [13-26] 16 (06/13 0555) BP: (98-136)/(52-100) 107/60 (06/13 0555) SpO2:  [89 %-95 %] 93 % (06/13 0555) Weight:  [81.8 kg (180 lb 5.4 oz)] 81.8 kg (180 lb 5.4 oz) (06/13 0208) Last BM Date: 12/26/16 Filed Weights   12/25/16 0500 12/26/16 0500 12/27/16 0208  Weight: 85 kg (187 lb 6.3 oz) 84.5 kg (186 lb 4.6 oz) 81.8 kg (180 lb 5.4 oz)   General: confused and lethargic almost stuporous.  Looks unwell.  Eyes anicteric Heart: RRR Chest: clear bil.  Some mild cough, throat clearing Abdomen: soft, NT, ND.  Active BS  Extremities: no CCE Neuro/Psych:  Difficult to awaken, falls back to "sleep" easily.  Can not tell me year, place or answer any questions appropriately.  Slight tremor in hands.  Cooperated to test limb strength which is 4/5 in right UE, otherwise full strength in the other 3 limbs.  Tongue is midline, smile symmetric.   No asterixis Intake/Output from previous day: 06/12 0701 - 06/13 0700 In: 1547.1 [I.V.:1497.1; IV Piggyback:50] Out: 925 [Urine:925]   Lab Results:  Recent Labs  12/26/16 0512 12/26/16 1019 12/26/16 1914 12/27/16 0628  WBC 12.2* 12.9*  --  9.0  HGB 8.7* 9.1* 9.2* 8.7*  HCT 27.0* 27.6* 28.0* 26.6*  PLT 96* 98*  --  94*   BMET  Recent Labs  12/25/16 0017 12/26/16 0512 12/27/16 0628  NA 136 142 141  K 4.1 3.9 3.4*  CL 109 112* 108  CO2 22 25 23   GLUCOSE 220* 116* 126*  BUN 10 15 9   CREATININE  0.68 0.44 0.46  CALCIUM 8.0* 7.8* 7.9*    ASSESMENT:   *  Upper GIB.   Previous EGD with banding x 5 for variceal bleed 12/10/16.   EGD 6/11: Clips applied to bleeding, post-banding esophageal ulcer with clot.   On Rocephin.    *  Acute Blood loss anemia from UGIB.  No transfusions thus far this admission. On once daily oral iron.    *  ETOH liver disease.  Cirrhosis.  Followed by Dr Clair Gulling, MD [facility].    *  Thrombocytopenia.   Splenomegaly per 12/11/16 ultrasound.    *  Hypokalemia.     *  AMS.  ? HE.  Getting her 30 g daily Lactulose as per PTA meds.     PLAN   *  Ammonia level this AM.    *  Ok to continue full liquids until mental status improves to where she can eat solids.    Addendum an 10:25 AM.  Spoke with her husband who says her behavior today started last PM, prior to transfer to SDU.  She was not thinking clearly and was tremulous. RN on ICU thought this was due to Trazodone.  However husband is pretty sure pt has taken  this med before without problem.       Sabrina Maynard  12/27/2016, 8:33 AM Pager: [phone]    [place] [facility] Attending   I have taken an interval history, reviewed the chart and examined the patient. I agree with the Advanced Practitioner's note, impression and recommendations.   She is worse and remains seriously ill though better at risk of deterioration.  I think she is demonstrating hepatic encephalopathy - delayed presentation - I expected it sooner but seems to be the issue. Await NH3 but will Tx more aggressively for HE and increase lactulose and add Xifaxan  Will f/u tomorrow  Iva Booparl E. Shea Swalley, MD, [facility] [phone] (pager) [phone] after 5 PM, weekends and holidays  12/27/2016 10:59 AM

## 2016-12-27 NOTE — Progress Notes (Signed)
Pt had total 3x BM watery and bloody mixed with urine each time, MD made aware.

## 2016-12-27 NOTE — Progress Notes (Signed)
[place] TEAM 1 - Stepdown/ICU TEAM  Sabrina Maynard  [id number] DOB: Jun 28, 1976 DOA: 12/25/2016 PCP: Kathe Bectoniehl, Anna Mae Elizabeth, MD    Brief Narrative:  41 y/o woman with a history of EtOH-related cirrhosis ([facility]) c/b esophogeal variceal bleed a few weeks ago s/p banding x6. She was in a rehab program at [facility] when she awoke to nausea and vomited blood. She presented to the ED where her hematemesis persisted and she was found to be hypotensive and anemic. She did have BRBPR as well. She was volume resuscitated with PRBCs, 2U FFP, and 8 L of NS, with resolution of her hypotension. GI was consulted for urgent endoscopy. She was intubated in the ED for airway protection.  Significant Events: 6/11 admitted by PCCM with emergent GI consultation - intubated > extubated 613 TRH assumed care  Subjective: The patient has improved in regard to her mental status this afternoon.  She reportedly was quite altered this morning.  Labs have confirmed hepatic encephalopathy as the etiology.  She presently denies chest pain shortness of breath abdominal pain fevers or chills.  She is hungry.  Assessment & Plan:  Acute life-threatening variceal upper GI bleed Emergent EGD 6/11 revealed ulceration at prior varix banding - hemostasis accomplished with epi injection + clips - octreotide and Protonix IV drips utilized - appears to be stabilizing  Acute blood loss anemia Required transfusion of multiple blood products - hemoglobin holding steady at this time - continue to follow trend especially as diet advanced  Hemorrhagic/hypovolemic shock Resolved with transfusion and aggressive volume resuscitation  Acute hepatic encephalopathy Due to significant blood loss into GI tract - medical therapy has been increased per GI  Alcohol related cirrhosis with varices [facility]  Acute hypoxic respiratory failure Due to difficulty protecting airway  as well as encephalopathy - much improved at this time  Hypomagnesemia Due to poor intake - replace and follow  Hypokalemia Supplement after magnesium administered  DVT prophylaxis: SCDs Code Status: FULL CODE Family Communication: no family present at time of exam  Disposition Plan: SDU   Consultants:  PCCM  [place] [facility]  Antimicrobials:  Ceftriaxone 6/11 >  Objective: Blood pressure (!) 100/56, pulse 77, temperature 98.9 F (37.2 C), temperature source Oral, resp. rate 18, height 5\' 4"  (1.626 m), weight 81.8 kg (180 lb 5.4 oz), last menstrual period 12/02/2016, SpO2 98 %.  Intake/Output Summary (Last 24 hours) at 12/27/16 1639 Last data filed at 12/27/16 1500  Gross per 24 hour  Intake          2337.08 ml  Output             1700 ml  Net           637.08 ml   Filed Weights   12/25/16 0500 12/26/16 0500 12/27/16 0208  Weight: 85 kg (187 lb 6.3 oz) 84.5 kg (186 lb 4.6 oz) 81.8 kg (180 lb 5.4 oz)    Examination: General: No acute respiratory distress Lungs: Clear to auscultation bilaterally without wheezes or crackles Cardiovascular: Regular rate and rhythm without murmur gallop or rub normal S1 and S2 Abdomen: Nontender, protuberent, soft, bowel sounds positive, no rebound, no ascites, no appreciable mass Extremities: No significant cyanosis, clubbing, or edema bilateral lower extremities  CBC:  Recent Labs Lab 12/25/16 0017  12/25/16 1705 12/25/16 2238 12/26/16 0512 12/26/16 1019 12/26/16 1914 12/27/16 0628  WBC 14.2*  < > 22.3* 15.7* 12.2* 12.9*  --  9.0  NEUTROABS 10.8*  --   --   --   --   --   --   --   HGB 6.3*  < > 10.2* 9.0* 8.7* 9.1* 9.2* 8.7*  HCT 20.4*  < > 30.4* 27.0* 27.0* 27.6* 28.0* 26.6*  MCV 106.3*  < > 91.8 91.8 92.8 92.3  --  93.0  PLT 158  < > 157 100* 96* 98*  --  94*  < > = values in this interval not displayed.   Basic Metabolic Panel:  Recent Labs Lab 12/25/16 0017 12/26/16 0512 12/27/16 0628  NA 136 142 141  K 4.1 3.9 3.4*    CL 109 112* 108  CO2 22 25 23   GLUCOSE 220* 116* 126*  BUN 10 15 9   CREATININE 0.68 0.44 0.46  CALCIUM 8.0* 7.8* 7.9*  MG  --   --  1.6*  PHOS  --   --  3.7   GFR: Estimated Creatinine Clearance: 98.6 mL/min (by C-G formula based on SCr of 0.46 mg/dL).  Liver Function Tests:  Recent Labs Lab 12/25/16 0017  AST 39  ALT 28  ALKPHOS 76  BILITOT 0.6  PROT 5.4*  ALBUMIN 2.5*    Recent Labs Lab 12/27/16 1027  AMMONIA 172*    Coagulation Profile:  Recent Labs Lab 12/25/16 0017 12/25/16 0139 12/26/16 0512  INR 1.32 1.69 1.20   CBG:  Recent Labs Lab 12/25/16 0315  GLUCAP 218*    Recent Results (from the past 240 hour(s))  MRSA PCR Screening     Status: None   Collection Time: 12/25/16  3:30 AM  Result Value Ref Range Status   MRSA by PCR NEGATIVE NEGATIVE Final    Comment:        The GeneXpert MRSA Assay (FDA approved for NASAL specimens only), is one component of a comprehensive MRSA colonization surveillance program. It is not intended to diagnose MRSA infection nor to guide or monitor treatment for MRSA infections.      Scheduled Meds: . citalopram  40 mg Oral Daily  . ferrous sulfate  325 mg Oral Q breakfast  . lactulose  30 g Oral QID  . nadolol  10 mg Oral Daily  . pantoprazole  40 mg Oral BID AC  . rifaximin  550 mg Oral BID  . thiamine  100 mg Oral Daily  . traZODone  100 mg Oral QHS     LOS: 2 days   Lonia Blood, MD [facility] Office  [phone] Pager - Text Page per Amion as per below:  On-Call/Text Page:      [email or web address]      password [email or web address]  If 7PM-7AM, please contact night-coverage [email or web address] Password [facility] 12/27/2016, 4:39 PM

## 2016-12-28 DIAGNOSIS — I864 Gastric varices: Secondary | ICD-10-CM

## 2016-12-28 DIAGNOSIS — K7031 Alcoholic cirrhosis of liver with ascites: Secondary | ICD-10-CM

## 2016-12-28 DIAGNOSIS — J9601 Acute respiratory failure with hypoxia: Secondary | ICD-10-CM

## 2016-12-28 DIAGNOSIS — J9602 Acute respiratory failure with hypercapnia: Secondary | ICD-10-CM

## 2016-12-28 DIAGNOSIS — K7290 Hepatic failure, unspecified without coma: Secondary | ICD-10-CM

## 2016-12-28 DIAGNOSIS — K7682 Hepatic encephalopathy: Secondary | ICD-10-CM

## 2016-12-28 LAB — TYPE AND SCREEN
ABO/RH(D): O POS
ANTIBODY SCREEN: NEGATIVE
UNIT DIVISION: 0
UNIT DIVISION: 0
UNIT DIVISION: 0
UNIT DIVISION: 0
UNIT DIVISION: 0
UNIT DIVISION: 0
UNIT DIVISION: 0
Unit division: 0
Unit division: 0
Unit division: 0
Unit division: 0
Unit division: 0

## 2016-12-28 LAB — COMPREHENSIVE METABOLIC PANEL
ALT: 30 U/L (ref 14–54)
AST: 46 U/L — AB (ref 15–41)
Albumin: 2.7 g/dL — ABNORMAL LOW (ref 3.5–5.0)
Alkaline Phosphatase: 53 U/L (ref 38–126)
Anion gap: 7 (ref 5–15)
BILIRUBIN TOTAL: 1.1 mg/dL (ref 0.3–1.2)
BUN: 5 mg/dL — AB (ref 6–20)
CHLORIDE: 108 mmol/L (ref 101–111)
CO2: 24 mmol/L (ref 22–32)
Calcium: 8.3 mg/dL — ABNORMAL LOW (ref 8.9–10.3)
Creatinine, Ser: 0.51 mg/dL (ref 0.44–1.00)
GFR calc Af Amer: >60 mL/min (ref >60)
Glucose, Bld: 129 mg/dL — ABNORMAL HIGH (ref 65–99)
POTASSIUM: 3.2 mmol/L — AB (ref 3.5–5.1)
Sodium: 139 mmol/L (ref 135–145)
Total Protein: 5.4 g/dL — ABNORMAL LOW (ref 6.5–8.1)

## 2016-12-28 LAB — BPAM RBC
BLOOD PRODUCT EXPIRATION DATE: 201806232359
BLOOD PRODUCT EXPIRATION DATE: 201807032359
BLOOD PRODUCT EXPIRATION DATE: 201807032359
BLOOD PRODUCT EXPIRATION DATE: 201807042359
BLOOD PRODUCT EXPIRATION DATE: 201807042359
BLOOD PRODUCT EXPIRATION DATE: 201807052359
Blood Product Expiration Date: 201806222359
Blood Product Expiration Date: 201807032359
Blood Product Expiration Date: 201807032359
Blood Product Expiration Date: 201807032359
Blood Product Expiration Date: 201807032359
Blood Product Expiration Date: 201807052359
ISSUE DATE / TIME: 201806110042
ISSUE DATE / TIME: 201806110042
ISSUE DATE / TIME: 201806110103
ISSUE DATE / TIME: 201806110103
ISSUE DATE / TIME: 201806110125
ISSUE DATE / TIME: 201806110125
ISSUE DATE / TIME: 201806110125
ISSUE DATE / TIME: 201806110125
UNIT TYPE AND RH: 5100
UNIT TYPE AND RH: 5100
UNIT TYPE AND RH: 9500
UNIT TYPE AND RH: 9500
Unit Type and Rh: 5100
Unit Type and Rh: 5100
Unit Type and Rh: 5100
Unit Type and Rh: 5100
Unit Type and Rh: 5100
Unit Type and Rh: 5100
Unit Type and Rh: 5100
Unit Type and Rh: 5100

## 2016-12-28 LAB — CBC
HEMATOCRIT: 27.3 % — AB (ref 36.0–46.0)
Hemoglobin: 8.8 g/dL — ABNORMAL LOW (ref 12.0–15.0)
MCH: 30.8 pg (ref 26.0–34.0)
MCHC: 32.2 g/dL (ref 30.0–36.0)
MCV: 95.5 fL (ref 78.0–100.0)
PLATELETS: 91 10*3/uL — AB (ref 150–400)
RBC: 2.86 MIL/uL — ABNORMAL LOW (ref 3.87–5.11)
RDW: 19.1 % — AB (ref 11.5–15.5)
WBC: 8.4 10*3/uL (ref 4.0–10.5)

## 2016-12-28 LAB — MAGNESIUM: Magnesium: 1.5 mg/dL — ABNORMAL LOW (ref 1.7–2.4)

## 2016-12-28 LAB — AMMONIA: Ammonia: 69 umol/L — ABNORMAL HIGH (ref 9–35)

## 2016-12-28 MED ORDER — LACTULOSE 10 GM/15ML PO SOLN
30.0000 g | Freq: Two times a day (BID) | ORAL | Status: DC
Start: 2016-12-28 — End: 2016-12-29
  Administered 2016-12-28 – 2016-12-29 (×2): 30 g via ORAL
  Filled 2016-12-28 (×2): qty 45

## 2016-12-28 MED ORDER — MAGNESIUM OXIDE 400 (241.3 MG) MG PO TABS
400.0000 mg | ORAL_TABLET | Freq: Two times a day (BID) | ORAL | Status: AC
  Administered 2016-12-28 (×2): 400 mg via ORAL
  Filled 2016-12-28 (×2): qty 1

## 2016-12-28 MED ORDER — POTASSIUM CHLORIDE CRYS ER 20 MEQ PO TBCR
50.0000 meq | EXTENDED_RELEASE_TABLET | Freq: Once | ORAL | Status: AC
  Administered 2016-12-28: 10:00:00 50 meq via ORAL
  Filled 2016-12-28: qty 1

## 2016-12-28 NOTE — Care Management Note (Signed)
Case Management Note  Patient Details  Name: Sabrina Maynard MRN: [id number] Date of Birth: [birth year]  Subjective/Objective:   [facility] , presents with GIB,  Advance to low NA diet and [facility], CSW following.   PCP Sabrina Maynard                  Action/Plan: NCM will follow along with CSW for dc needs.   Expected Discharge Date:                  Expected Discharge Plan:   (From [facility])  In-House Referral:  Clinical Social Work  Discharge planning Services  CM Consult  Post Acute Care Choice:    Choice offered to:     DME Arranged:    DME Agency:     HH Arranged:    HH Agency:     Status of Service:  In process, will continue to follow  If discussed at Long Length of Stay Meetings, dates discussed:    Additional Comments:  Leone Havenaylor, Tanique Matney Clinton, RN 12/28/2016, 12:56 PM

## 2016-12-28 NOTE — Progress Notes (Addendum)
6/15: CSW has not heard back from [facility]. CSW provided patient with list of other facilities for treatment. CSW signing off.  2pm: [facility] discharged from their facility due to medical acuity. They have provided patient's spouse with a list of facilities that may be able to handle her medical needs, but she would have to make appointments to be assessed. CSW sent patient's clinicals (with permission from the patient) so that [facility] can be convinced to accept her back.  11am-CSW alerted by MD [facility]. CSW left voicemail for facility.  Osborne Cascoadia Aristotle Lieb LCSWA [phone]

## 2016-12-28 NOTE — Discharge Summary (Signed)
Physician Discharge Summary  Sabrina CrumbleLauren P Maynard WUJ:[id number]:[id number] DOB: 10-May-1976 DOA: 12/25/2016  PCP: Kathe Bectoniehl, Anna Mae Elizabeth, MD  Admit date: 12/25/2016 Discharge date: 12/29/2016  Time spent: 35 minutes  Recommendations for Outpatient Follow-up:   Acute life-threatening variceal upper GI bleed -Emergent EGD 6/11 revealed ulceration at prior varix banding  -hemostasis accomplished with epi injection + clips - octreotide and Protonix IV drips utilized -Appears resolved. -GI recommends follow-up EGD in 4-6 weeks  Acute blood loss anemia 6/11 transfused 2 units FFP 6/11 transfused 6 units PRBC Recent Labs     12/26/16  1914  12/27/16  0628  12/28/16  0429  HGB  9.2*  8.7*  8.8*  -Stable at discharge, no further sign of bleeding  Acute hepatic encephalopathy -Due to significant blood loss into GI tract  -Encephalopathy has resolved. Reviewed signs and symptoms with her husband and patient to monitor for, and went over when patient should return to ED. -Lactulose BID  -Rifaximin 550 mg BID -Schedule follow-up for 1-2 weeks with Dr. Mikel CellaAnn Mae Blossom HoopsElizabeth Diehl [facility]  Acute hypoxic respiratory failure -Due to difficulty protecting airway as well as encephalopathy, resolved  Hypomagnesemia/Hypokalemia -Magnesium oxide 800 mg 1 prior to discharge -Magnesium oxide 400 mg  BID -PCP/Hepatologist to monitor electrolytes closely   Ascites due to alcoholic cirrhosis -Patient states has never had paracentesis but appears firm/bloated.  -Spoke with GI PA  Jennye MoccasinSarah Gribbin who felt no significant abdominal girth change (previous abdominal ultrasound negative for significant ascites). Feels discomfort most likely secondary to increased lactulose. Have decreased lactulose dose per GI recommendations prior to discharge. -Counseled patient and husband on SSX which we be consistent withacute Hepatic decompensation which would require patient to immediately see her Hepatologist or  return to ED.  SBP prophylaxis -Follwed by Dr. Mikel CellaAnn Mae Blossom HoopsElizabeth Diehl at [facility], will allow her to determine need for prophylaxis.     Discharge Diagnoses:  Principal Problem:   Acute esophageal ulcer with hemorrhage Active Problems:   Acute upper GI bleed   Encephalopathy, hepatic (HCC)   Discharge Condition: Stable  Diet recommendation: Low sodium diet  Filed Weights   12/28/16 0428 12/28/16 1618 12/29/16 0450  Weight: 176 lb 12.9 oz (80.2 kg) 172 lb 2.9 oz (78.1 kg) 169 lb 5 oz (76.8 kg)    History of present illness:  41 y/o WF PMHx EtOH abuse, EtOH-related cirrhosis ([facility]) S/P Esophogeal variceal bleed a few weeks ago s/p banding x6. She was in a rehab program at [facility]   When she awoke to nausea and vomited blood. She presented to the ED where her hematemesis persisted and she was found to be hypotensive and anemic. She did have BRBPR as well. She was volume resuscitated with PRBCs, 2U FFP, and 8 L of NS, with resolution of her hypotension. GI was consulted for urgent endoscopy. She was intubated in the ED for airway protection. During his hospitalization patient was treated for acute blood loss anemia secondary to esophageal variceal bleeds. Required multiple units of blood products. EGD showed multiple sites of rebleed and clips were applied. Patient was treated with multiple rounds of lactulose and ammonia level decreased. Bleeding and hepatic encephalopathy resolved.   Procedures: 6/11 admitted by PCCM with emergent GI consultation - intubated > extubated 6/11 EGD: oozing ulcer at site of previous varix banding. This was a complication from previous banding of varices. Ulcer was injected and 6 clips placed.  6/11 transfused 2 units  FFP 6/11 transfused 6 units PRBC    Consultations: [facility] Dr. Stan Head    Antibiotics Anti-infectives    Start     Stop   12/27/16 1130  rifaximin (XIFAXAN) tablet  550 mg         12/26/16 0000  cefTRIAXone (ROCEPHIN) 1 g in dextrose 5 % 50 mL IVPB     12/29/16 0221   12/25/16 0030  cefTRIAXone (ROCEPHIN) 1 g in dextrose 5 % 50 mL IVPB     12/25/16 0156       Discharge Exam: Vitals:   12/29/16 0020 12/29/16 0449 12/29/16 0450 12/29/16 0958  BP: (!) 110/57 (!) 96/44 (!) 90/51 114/65  Pulse: 79 70 76 79  Resp:  18    Temp:  99.4 F (37.4 C)    TempSrc:  Oral    SpO2: 95% 96% 97%   Weight:   169 lb 5 oz (76.8 kg)   Height:        General: A/O 4, No acute respiratory distress Cardiovascular: Regular in rate, negative murmurs rubs or gallops, normal S1/S2 Respiratory: Clear to auscultation bilaterally wheezes or crackles Abdomen: Distended negative abdominal pain, positive soft bowel sounds, positive ascites   Discharge Instructions   Allergies as of 12/29/2016      Reactions   Crab [shellfish Allergy] Nausea And Vomiting   Erythromycin Other (See Comments)   GI issues      Medication List    STOP taking these medications   DIASTAT ACUDIAL 10 MG Gel Generic drug:  diazepam   folic acid 1 MG tablet Commonly known as:  FOLVITE     TAKE these medications   A+D FIRST AID Oint Apply 1 application topically daily as needed (apply to affected area).   citalopram 40 MG tablet Commonly known as:  CELEXA Take 40 mg by mouth daily.   ferrous sulfate 325 (65 FE) MG tablet Take 325 mg by mouth daily with breakfast.   lactulose 10 GM/15ML solution Commonly known as:  CHRONULAC Take 45 mLs (30 g total) by mouth 2 (two) times daily. What changed:  when to take this   magnesium oxide 400 MG tablet Commonly known as:  MAG-OX Take 1 tablet (400 mg total) by mouth 2 (two) times daily.   Melatonin 3 MG Tabs Take 3 mg by mouth at bedtime.   multivitamin with minerals Tabs tablet Take 1 tablet by mouth daily.   nadolol 20 MG tablet Commonly known as:  CORGARD Take 10 mg by mouth daily.   pantoprazole 40 MG tablet Commonly  known as:  PROTONIX Take 1 tablet (40 mg total) by mouth 2 (two) times daily.   protein supplement shake Liqd Commonly known as:  PREMIER PROTEIN Take by mouth 2 (two) times daily. 1 shake twice daily   rifaximin 550 MG Tabs tablet Commonly known as:  XIFAXAN Take 1 tablet (550 mg total) by mouth 2 (two) times daily.   thiamine 100 MG tablet Take 1 tablet (100 mg total) by mouth daily.   traZODone 100 MG tablet Commonly known as:  DESYREL Take 100 mg by mouth at bedtime.   WELLNESS PROTEIN SHAKE PO Take 1 each by mouth daily. Power crunch protein shake      Allergies  Allergen Reactions  . Crab [Shellfish Allergy] Nausea And Vomiting  . Erythromycin Other (See Comments)    GI issues   Follow-up Information    Kathe Becton, MD. Schedule an appointment as soon as  possible for a visit in 2 week(s).   Specialty:  Gastroenterology Why:  if you do not already have follow up with her, contact office to arrange follow up of GI bleeding and liver disease following discharge from [facility].  Contact information: [address] [facility] [address] [place] [address] [phone]            The results of significant diagnostics from this hospitalization (including imaging, microbiology, ancillary and laboratory) are listed below for reference.    Significant Diagnostic Studies: US Abdomen Complete  Result Date: 12/11/2016 CLINICAL DATA:  History of alcoholic cirrhosis with recent upper GI bleed secondary to esophageal varices. EXAM: 1. ABDOMINAL ULTRASOUND 2. DUPLEX ULTRASOUND OF LIVER TECHNIQUE: Standard protocol abdominal ultrasound was performed followed by color and duplex Doppler ultrasound to evaluate the hepatic in-flow and out-flow vessels. COMPARISON:  None. FINDINGS: Gallbladder: There is nonspecific gallbladder wall thickening and pericholecystic fluid (representative images 9 and 11). No echogenic gallstones or biliary sludge. Negative sonographic  Murphy's sign. Common bile duct: Diameter: Normal in size measuring 3.1 mm in diameter Liver: There is increased coarsened echogenicity of the hepatic parenchyma with mild nodularity of the hepatic contour. No discrete hepatic lesions. No intrahepatic bili duct dilatation. No ascites. IVC: No abnormality visualized. Pancreas: Visualized portion unremarkable. Spleen: Borderline enlarged measuring 12 cm 12.3 x 17.0 x 5.4 cm with calculated volume of 567.5 cm cubed Right Kidney: Normal cortical thickness, echogenicity and size, measuring 13.5 cm in length. No focal renal lesions. No echogenic renal stones. No urinary obstruction. Left Kidney: Normal cortical thickness, echogenicity and size, measuring 30.5 cm in length. No focal renal lesions. No echogenic renal stones. No urinary obstruction. Abdominal aorta: No aneurysm visualized. _________________________________________________________ Portal Vein Velocities Main:  26.2 cm/sec Right:  25.5 cm/sec Left:  20.3 cm/sec Hepatic Vein Velocities Right:  67.6 cm/sec Middle:  73.2 cm/sec Left:  36.1 cm/sec Hepatic Artery Velocity:  135.2 cm/sec Splenic Vein Velocity:  22 cm/sec Varices: None visualized Ascites: None visualized IMPRESSION: 1. Nonspecific gallbladder wall thickening and pericholecystic fluid without echogenic stones or biliary sludge. If clinical concern persists for acute acalculous cholecystitis, further evaluation with nuclear medicine HIDA scan could be performed as indicated. 2. Increased coarsened echogenicity of the hepatic parenchyma with associated mild nodularity hepatic contour, findings suggestive of cirrhosis. 3. Splenomegaly, likely indicative of portal venous hypertension. No ascites. 4. Widely patent hepatic vascular system. Electronically Signed   By: Simonne Come M.D.   On: 12/11/2016 13:35   [place] Art/ven Flow Abd Pelv Doppler  Result Date: 12/11/2016 CLINICAL DATA:  History of alcoholic cirrhosis with recent upper GI bleed secondary to  esophageal varices. EXAM: 1. ABDOMINAL ULTRASOUND 2. DUPLEX ULTRASOUND OF LIVER TECHNIQUE: Standard protocol abdominal ultrasound was performed followed by color and duplex Doppler ultrasound to evaluate the hepatic in-flow and out-flow vessels. COMPARISON:  None. FINDINGS: Gallbladder: There is nonspecific gallbladder wall thickening and pericholecystic fluid (representative images 9 and 11). No echogenic gallstones or biliary sludge. Negative sonographic Murphy's sign. Common bile duct: Diameter: Normal in size measuring 3.1 mm in diameter Liver: There is increased coarsened echogenicity of the hepatic parenchyma with mild nodularity of the hepatic contour. No discrete hepatic lesions. No intrahepatic bili duct dilatation. No ascites. IVC: No abnormality visualized. Pancreas: Visualized portion unremarkable. Spleen: Borderline enlarged measuring 12 cm 12.3 x 17.0 x 5.4 cm with calculated volume of 567.5 cm cubed Right Kidney: Normal cortical thickness, echogenicity and size, measuring 13.5 cm in length. No focal renal lesions. No  echogenic renal stones. No urinary obstruction. Left Kidney: Normal cortical thickness, echogenicity and size, measuring 30.5 cm in length. No focal renal lesions. No echogenic renal stones. No urinary obstruction. Abdominal aorta: No aneurysm visualized. _________________________________________________________ Portal Vein Velocities Main:  26.2 cm/sec Right:  25.5 cm/sec Left:  20.3 cm/sec Hepatic Vein Velocities Right:  67.6 cm/sec Middle:  73.2 cm/sec Left:  36.1 cm/sec Hepatic Artery Velocity:  135.2 cm/sec Splenic Vein Velocity:  22 cm/sec Varices: None visualized Ascites: None visualized IMPRESSION: 1. Nonspecific gallbladder wall thickening and pericholecystic fluid without echogenic stones or biliary sludge. If clinical concern persists for acute acalculous cholecystitis, further evaluation with nuclear medicine HIDA scan could be performed as indicated. 2. Increased coarsened  echogenicity of the hepatic parenchyma with associated mild nodularity hepatic contour, findings suggestive of cirrhosis. 3. Splenomegaly, likely indicative of portal venous hypertension. No ascites. 4. Widely patent hepatic vascular system. Electronically Signed   By: Simonne Come M.D.   On: 12/11/2016 13:35   Dg Chest Portable 1 View  Result Date: 12/25/2016 CLINICAL DATA:  ET tube placement EXAM: PORTABLE CHEST 1 VIEW COMPARISON:  12/10/2016 FINDINGS: Endotracheal tube tip is approximately 2.1 cm superior to the carina. Low lung volumes. Increased opacity at the right lung base. No pleural effusion. Mild cardiomegaly. IMPRESSION: 1. Endotracheal tube tip about 2.1 cm superior to carina 2. Low lung volumes with right basilar atelectasis or infiltrate 3. Cardiomegaly Electronically Signed   By: Jasmine Pang M.D.   On: 12/25/2016 03:06   Dg Abd Acute W/chest  Result Date: 12/10/2016 CLINICAL DATA:  Severe abdominal pain after endoscopy today. History of cirrhosis. EXAM: DG ABDOMEN ACUTE W/ 1V CHEST COMPARISON:  None. FINDINGS: Cardiac silhouette is mildly enlarged, mediastinal silhouette is nonsuspicious. Pulmonary vascular congestion without pleural effusion or focal consolidation. No pneumothorax. Soft tissue planes included osseous structures are nonsuspicious. Bowel gas pattern is nondilated and nonobstructive. No intraperitoneal free air. No intra-abdominal mass effect or pathologic calcifications. Soft tissue planes and included osseous structures are normal. IMPRESSION: Mild cardiomegaly and pulmonary vascular congestion. Normal bowel gas pattern. Electronically Signed   By: Awilda Metro M.D.   On: 12/10/2016 23:54    Microbiology: Recent Results (from the past 240 hour(s))  MRSA PCR Screening     Status: None   Collection Time: 12/25/16  3:30 AM  Result Value Ref Range Status   MRSA by PCR NEGATIVE NEGATIVE Final    Comment:        The GeneXpert MRSA Assay (FDA approved for NASAL  specimens only), is one component of a comprehensive MRSA colonization surveillance program. It is not intended to diagnose MRSA infection nor to guide or monitor treatment for MRSA infections.      Labs: Basic Metabolic Panel:  Recent Labs Lab 12/25/16 0017 12/26/16 0512 12/27/16 0628 12/28/16 0429 12/28/16 0819  NA 136 142 141 139  --   K 4.1 3.9 3.4* 3.2*  --   CL 109 112* 108 108  --   CO2 22 25 23 24   --   GLUCOSE 220* 116* 126* 129*  --   BUN 10 15 9  5*  --   CREATININE 0.68 0.44 0.46 0.51  --   CALCIUM 8.0* 7.8* 7.9* 8.3*  --   MG  --   --  1.6*  --  1.5*  PHOS  --   --  3.7  --   --    Liver Function Tests:  Recent Labs Lab 12/25/16 0017 12/28/16 0429  AST  39 46*  ALT 28 30  ALKPHOS 76 53  BILITOT 0.6 1.1  PROT 5.4* 5.4*  ALBUMIN 2.5* 2.7*   No results for input(s): LIPASE, AMYLASE in the last 168 hours.  Recent Labs Lab 12/27/16 1027 12/28/16 0429  AMMONIA 172* 69*   CBC:  Recent Labs Lab 12/25/16 0017  12/25/16 2238 12/26/16 0512 12/26/16 1019 12/26/16 1914 12/27/16 0628 12/28/16 0429  WBC 14.2*  < > 15.7* 12.2* 12.9*  --  9.0 8.4  NEUTROABS 10.8*  --   --   --   --   --   --   --   HGB 6.3*  < > 9.0* 8.7* 9.1* 9.2* 8.7* 8.8*  HCT 20.4*  < > 27.0* 27.0* 27.6* 28.0* 26.6* 27.3*  MCV 106.3*  < > 91.8 92.8 92.3  --  93.0 95.5  PLT 158  < > 100* 96* 98*  --  94* 91*  < > = values in this interval not displayed. Cardiac Enzymes: No results for input(s): CKTOTAL, CKMB, CKMBINDEX, TROPONINI in the last 168 hours. BNP: BNP (last 3 results) No results for input(s): BNP in the last 8760 hours.  ProBNP (last 3 results) No results for input(s): PROBNP in the last 8760 hours.  CBG:  Recent Labs Lab 12/25/16 0315  GLUCAP 218*       Signed:  Carolyne Littles, MD [facility] [phone] pager

## 2016-12-28 NOTE — Progress Notes (Signed)
Daily Rounding Note  12/28/2016, 10:24 AM  LOS: 3 days   SUBJECTIVE:   Chief complaint: confusion: resolved with addition of Rifaxamin and up-dosing of laclulose.  Several stools overnight, still dark and loose but she is taking po iron.  Confusion is resolved, sitting quietly with spouse at bedside      OBJECTIVE:         Vital signs in last 24 hours:    Temp:  [98.7 F (37.1 C)-99.2 F (37.3 C)] 99 F (37.2 C) (06/14 0737) Pulse Rate:  [59-85] 81 (06/14 0737) Resp:  [13-27] 21 (06/14 0737) BP: (94-126)/(50-101) 115/63 (06/14 0737) SpO2:  [89 %-99 %] 95 % (06/14 0737) Weight:  [80.2 kg (176 lb 12.9 oz)] 80.2 kg (176 lb 12.9 oz) (06/14 0428) Last BM Date: 12/27/16 Filed Weights   12/26/16 0500 12/27/16 0208 12/28/16 0428  Weight: 84.5 kg (186 lb 4.6 oz) 81.8 kg (180 lb 5.4 oz) 80.2 kg (176 lb 12.9 oz)   General: looks much better    Heart: RRR Chest: clear bil.   Abdomen: soft, protuberant, NT, active BS.  Extremities: no CCE Neuro/Psych:  Oriented x 3.  No tremor.  No deficits.  Fully present.  Does not recall events of earlier yesterday.   Lab Results:  Recent Labs  12/26/16 1019 12/26/16 1914 12/27/16 0628 12/28/16 0429  WBC 12.9*  --  9.0 8.4  HGB 9.1* 9.2* 8.7* 8.8*  HCT 27.6* 28.0* 26.6* 27.3*  PLT 98*  --  94* 91*   BMET  Recent Labs  12/26/16 0512 12/27/16 0628 12/28/16 0429  NA 142 141 139  K 3.9 3.4* 3.2*  CL 112* 108 108  CO2 25 23 24   GLUCOSE 116* 126* 129*  BUN 15 9 5*  CREATININE 0.44 0.46 0.51  CALCIUM 7.8* 7.9* 8.3*   LFT  Recent Labs  12/28/16 0429  PROT 5.4*  ALBUMIN 2.7*  AST 46*  ALT 30  ALKPHOS 53  BILITOT 1.1     ASSESMENT:   *  Acute GIB with hematemesis from ulder at site of previous esophageal variceal banding.   Rocephin day 4.    *  Blood loss anemia from GIB.  S/p PRBC x 2.  Hgb stable.   *  Coagulopathy.  S/p FFP x 2.      *  Cirrhosis from  ETOH.  Rehab at [facility] has been interrupted.   *  Thrombocytopenia.    *  HE.  Rifaximin added and Lactulose dose increased 6/13. Her abdominal bloating may be from the lactulose.     PLAN   *  Ok to advance to low Na diet and transfer to [facility], later today vs tomorrow. .  At discharge, can the D/C summary be forwarded to Dr Clair Gull[facility]Anna Mae [facility], her hepatologist?   *  Cancelled order for ultrasound (to assess for ascites, per Dr Lucretia RoersWood) she had same on 5/28 and no ascites.  Her weight is down several pounds.     *  Decrease Lactulose to BID.      Jennye MoccasinSarah Gribbin  12/28/2016, 10:24 AM Pager: [phone]    [facility] Attending   I have taken an interval history, reviewed the chart and examined the patient. I agree with the Advanced Practitioner's note, impression and recommendations.    She is much improved. Will sign off. Hopefully dc tomorrow.  Iva Booparl E. Nykerria Macconnell, MD, [facility] [phone] (pager) [phone]  after 5 PM, weekends and holidays  12/28/2016 6:10 PM

## 2016-12-29 DIAGNOSIS — J9601 Acute respiratory failure with hypoxia: Secondary | ICD-10-CM

## 2016-12-29 DIAGNOSIS — E876 Hypokalemia: Secondary | ICD-10-CM

## 2016-12-29 DIAGNOSIS — K7031 Alcoholic cirrhosis of liver with ascites: Secondary | ICD-10-CM

## 2016-12-29 DIAGNOSIS — I864 Gastric varices: Secondary | ICD-10-CM

## 2016-12-29 MED ORDER — LACTULOSE 10 GM/15ML PO SOLN: 30.0000 g | Freq: Two times a day (BID) | ORAL | 0 refills | Status: AC

## 2016-12-29 MED ORDER — RIFAXIMIN 550 MG PO TABS: 550.0000 mg | ORAL_TABLET | Freq: Two times a day (BID) | ORAL | 0 refills | Status: AC

## 2016-12-29 MED ORDER — MAGNESIUM OXIDE 400 (241.3 MG) MG PO TABS
800.0000 mg | ORAL_TABLET | Freq: Once | ORAL | Status: AC
Start: 2016-12-29 — End: 2016-12-29
  Administered 2016-12-29: 800 mg via ORAL
  Filled 2016-12-29: qty 2

## 2016-12-29 MED ORDER — MAGNESIUM OXIDE 400 MG PO TABS: 400.0000 mg | ORAL_TABLET | Freq: Two times a day (BID) | ORAL | 0 refills | Status: AC

## 2016-12-29 NOTE — Progress Notes (Signed)
Sabrina CrumbleLauren P Maynard to be D/C'd to home per MD order.  Discussed with the patient and all questions fully answered.  VSS, Skin clean, dry and intact without evidence of skin break down, no evidence of skin tears noted. IV catheter discontinued intact. Site without signs and symptoms of complications. Dressing and pressure applied.  An After Visit Summary was printed and given to the patient. Patient received prescriptions.  D/c education completed with patient/family including follow up instructions, medication list, d/c activities limitations if indicated, with other d/c instructions as indicated by MD - patient able to verbalize understanding, all questions fully answered.   Patient instructed to return to ED, call 911, or call MD for any changes in condition.   Patient escorted via WC, and D/C home via private auto.  Sabrina HaffKayla L Maynard 12/29/2016 2:18 PM

## 2017-01-22 ENCOUNTER — Telehealth: Payer: Self-pay

## 2017-01-22 NOTE — Telephone Encounter (Signed)
Dr. Adela LankArmbruster aware.

## 2017-01-22 NOTE — Telephone Encounter (Signed)
-----   Message from Ruffin FrederickSteven Paul Armbruster, MD sent at 01/22/2017  4:05 PM EDT ----- Regarding: RE: follow up EGD Thanks Bonita QuinLinda  ----- Message ----- From: Chrystie NoseHunt, Linda R, RN Sent: 01/22/2017   1:43 PM To: Ruffin FrederickSteven Paul Armbruster, MD Subject: FW: follow up EGD                              Spoke with pt and she states [facility] and they will take care of the repeat procedure.  ----- Message ----- From: Ruffin FrederickArmbruster, Steven Paul, MD Sent: 01/22/2017   7:29 AM To: Leverne HumblesJulia M Fournier, RN Subject: follow up EGD                                  This patient was admitted close to a month ago for GI bleeding after having been banded for a variceal bleed. She is in need of a follow up EGD - it is not clear to me if this patient will be following [facility] at [facility] for this where her primary GI is located or if he needs it done with us if [place] in [place]. Can you help clarify with her? Thanks

## 2018-04-29 IMAGING — US US ART/VEN ABD/PELV/SCROTUM DOPPLER LTD
1 series · 13 of 25 positions shown · non-contrast
Comparison: None.

CLINICAL DATA: History of alcoholic cirrhosis with recent upper GI
bleed secondary to esophageal varices.

EXAM:
1. ABDOMINAL ULTRASOUND
2. DUPLEX ULTRASOUND OF LIVER
TECHNIQUE: Standard protocol abdominal ultrasound was performed followed by
color and duplex Doppler ultrasound to evaluate the hepatic in-flow
and out-flow vessels.

[Series 1: us art/ven abd/pelv/scrotum doppler ltd · 0.28mm/px · 13 of 36 slices shown]
[im 1/36]
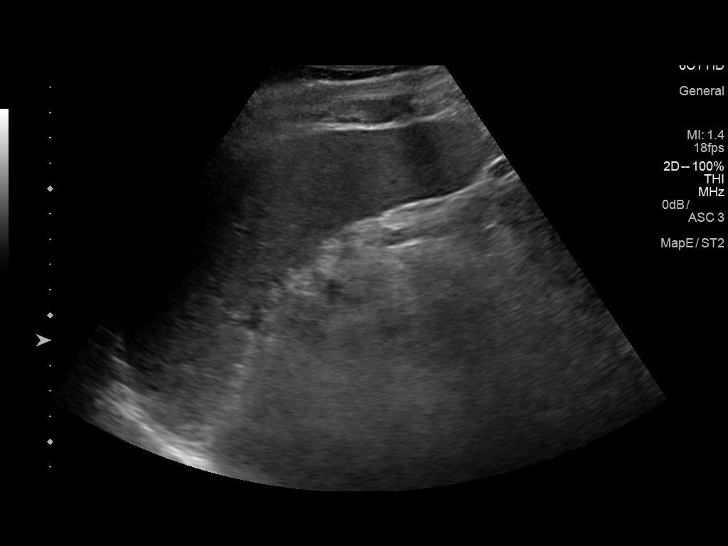
[im 3/36]
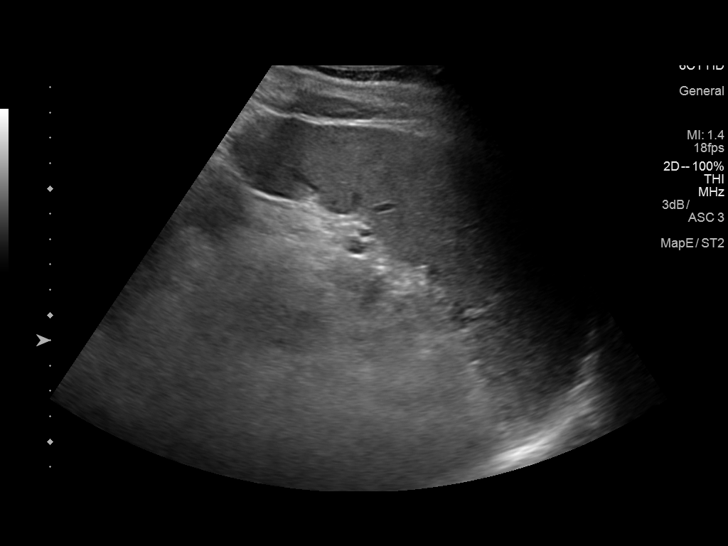
[im 6/36]
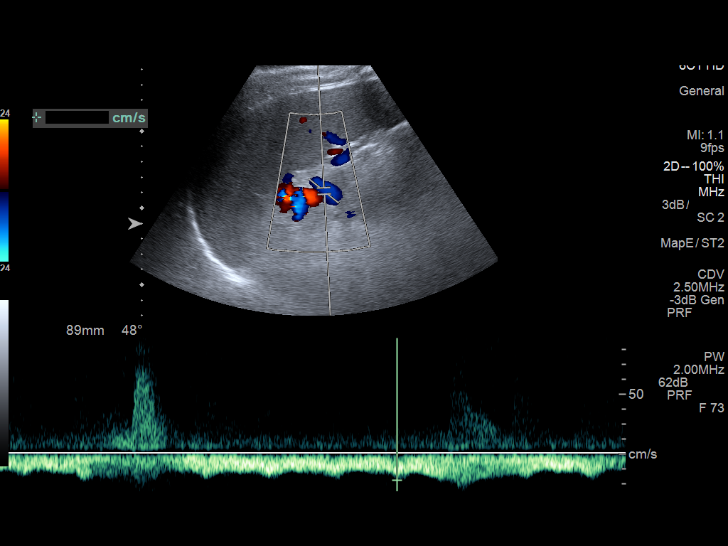
[im 9/36]
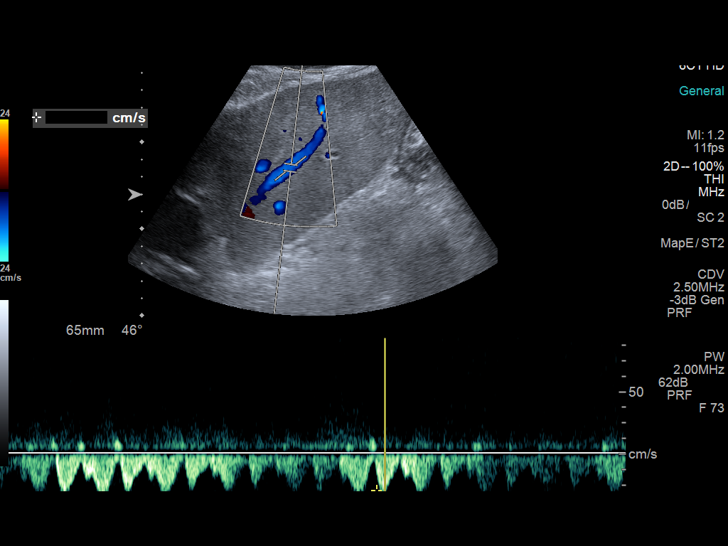
[im 12/36]
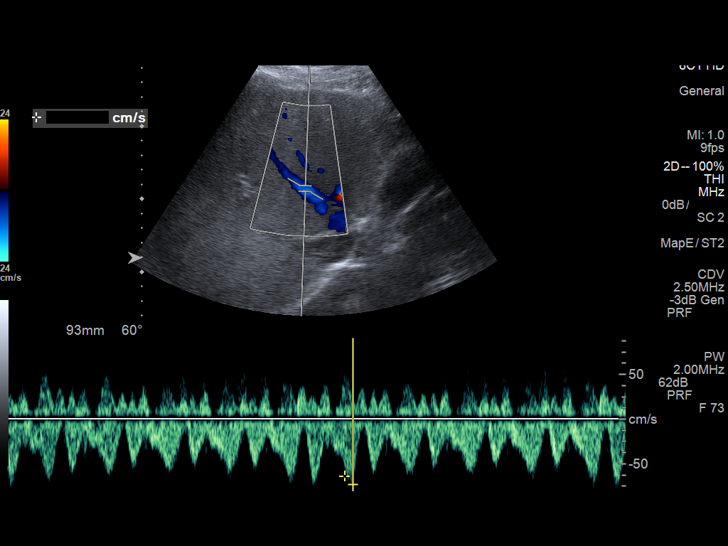
[im 15/36]
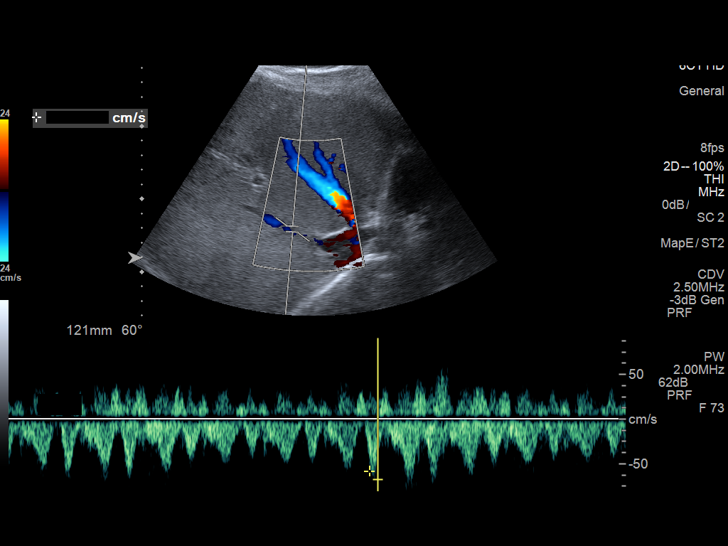
[im 18/36]
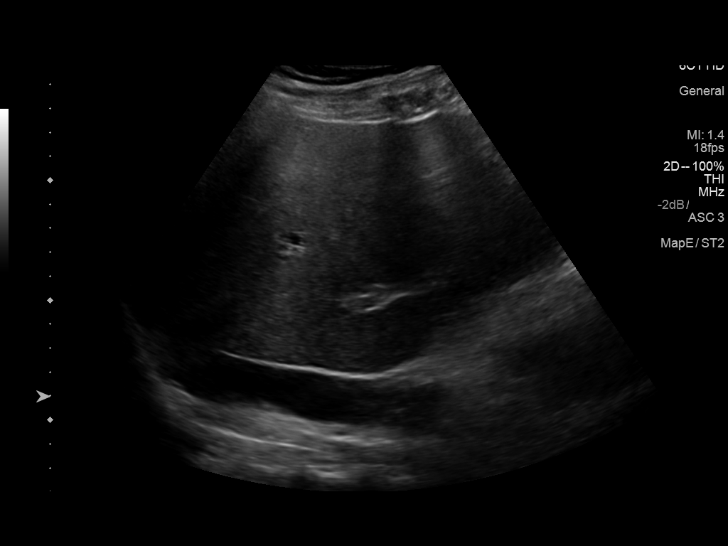
[im 21/36]
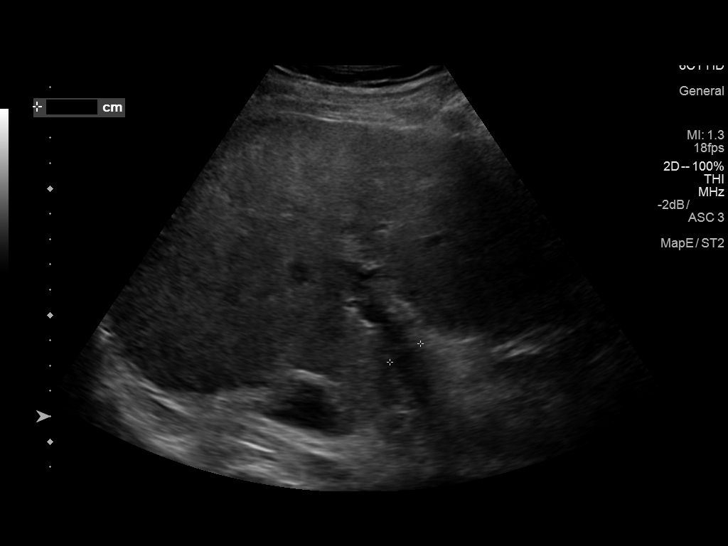
[im 24/36]
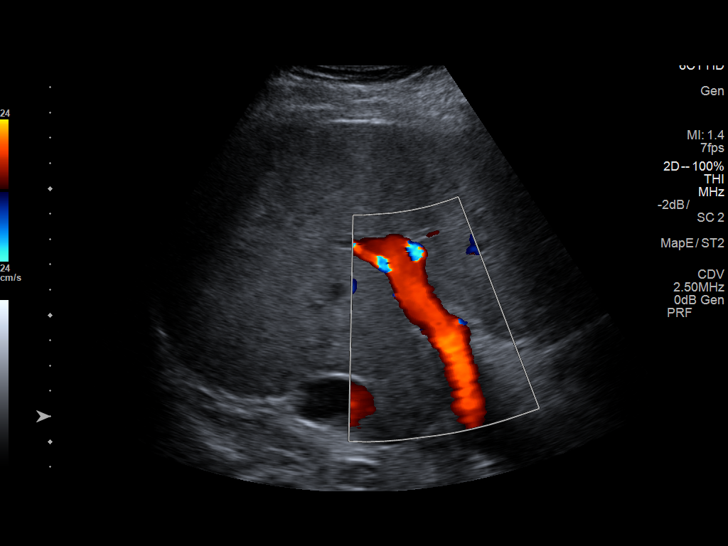
[im 27/36]
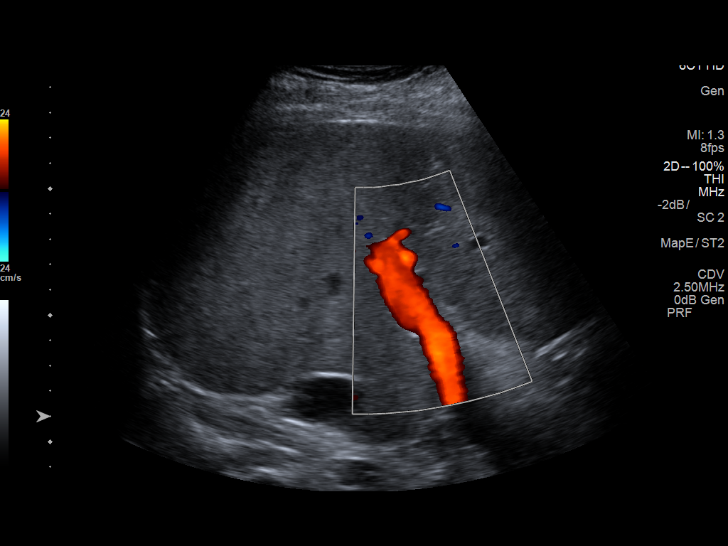
[im 30/36]
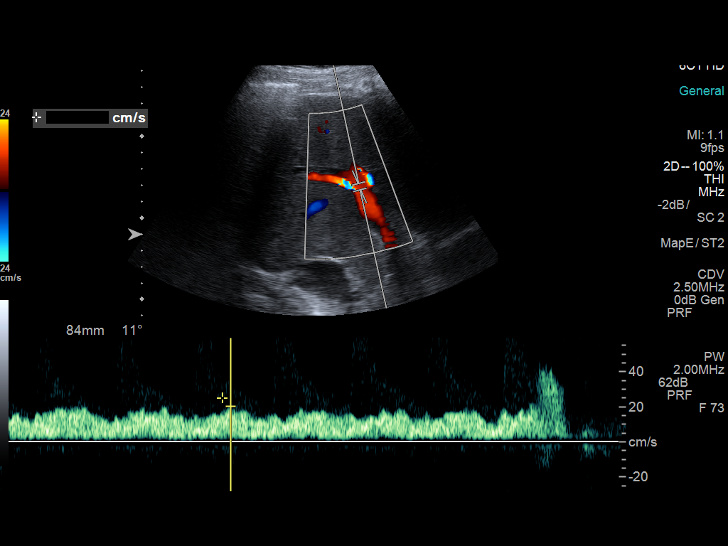
[im 33/36]
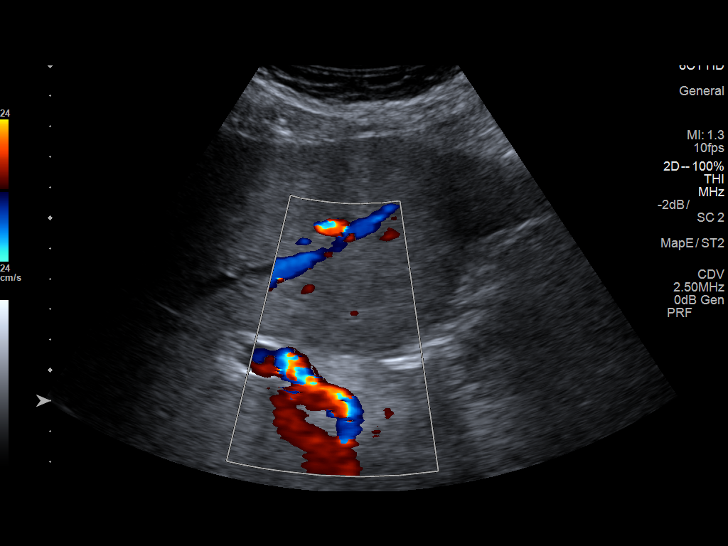
[im 36/36]
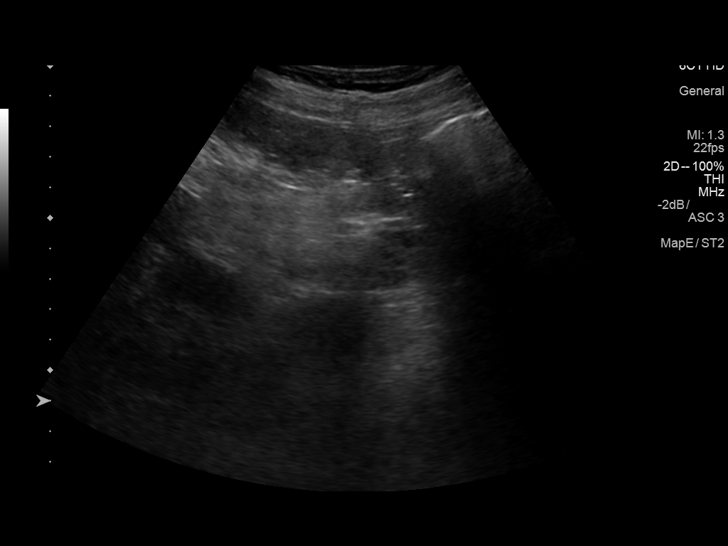

[13 of 25 positions shown; findings below may reference images not displayed]

FINDINGS: Gallbladder: There is nonspecific gallbladder wall thickening and
pericholecystic fluid (representative images 9 and 11). No echogenic
gallstones or biliary sludge. Negative sonographic Murphy's sign.

Common bile duct: Diameter: Normal in size measuring 3.1 mm in
diameter

Liver: There is increased coarsened echogenicity of the hepatic
parenchyma with mild nodularity of the hepatic contour. No discrete
hepatic lesions. No intrahepatic bili duct dilatation. No ascites.

IVC: No abnormality visualized.

Pancreas: Visualized portion unremarkable.

Spleen: Borderline enlarged measuring 12 cm 12.3 x 17.0 x 5.4 cm
with calculated volume of 567.5 cm cubed

Right Kidney: Normal cortical thickness, echogenicity and size,
measuring 13.5 cm in length. No focal renal lesions. No echogenic
renal stones. No urinary obstruction.

Left Kidney: Normal cortical thickness, echogenicity and size,
measuring 30.5 cm in length. No focal renal lesions. No echogenic
renal stones. No urinary obstruction.

Abdominal aorta: No aneurysm visualized.

_________________________________________________________

Portal Vein Velocities

Main:  26.2 cm/sec

Right:  25.5 cm/sec

Left:  20.3 cm/sec

Hepatic Vein Velocities

Right:  67.6 cm/sec

Middle:  73.2 cm/sec

Left:  36.1 cm/sec

Hepatic Artery Velocity:  135.2 cm/sec

Splenic Vein Velocity:  22 cm/sec

Varices: None visualized

Ascites: None visualized
IMPRESSION: 1. Nonspecific gallbladder wall thickening and pericholecystic fluid
without echogenic stones or biliary sludge. If clinical concern
persists for acute acalculous cholecystitis, further evaluation with
nuclear medicine HIDA scan could be performed as indicated.
2. Increased coarsened echogenicity of the hepatic parenchyma with
associated mild nodularity hepatic contour, findings suggestive of
cirrhosis.
3. Splenomegaly, likely indicative of portal venous hypertension. No
ascites.
4. Widely patent hepatic vascular system.

## 2018-05-07 IMAGING — DX DG ABDOMEN ACUTE W/ 1V CHEST
3 series · 3 of 3 positions shown · non-contrast
Comparison: None.

CLINICAL DATA: Severe abdominal pain after endoscopy today. History
of cirrhosis.

EXAM:
DG ABDOMEN ACUTE W/ 1V CHEST

[chest pa]
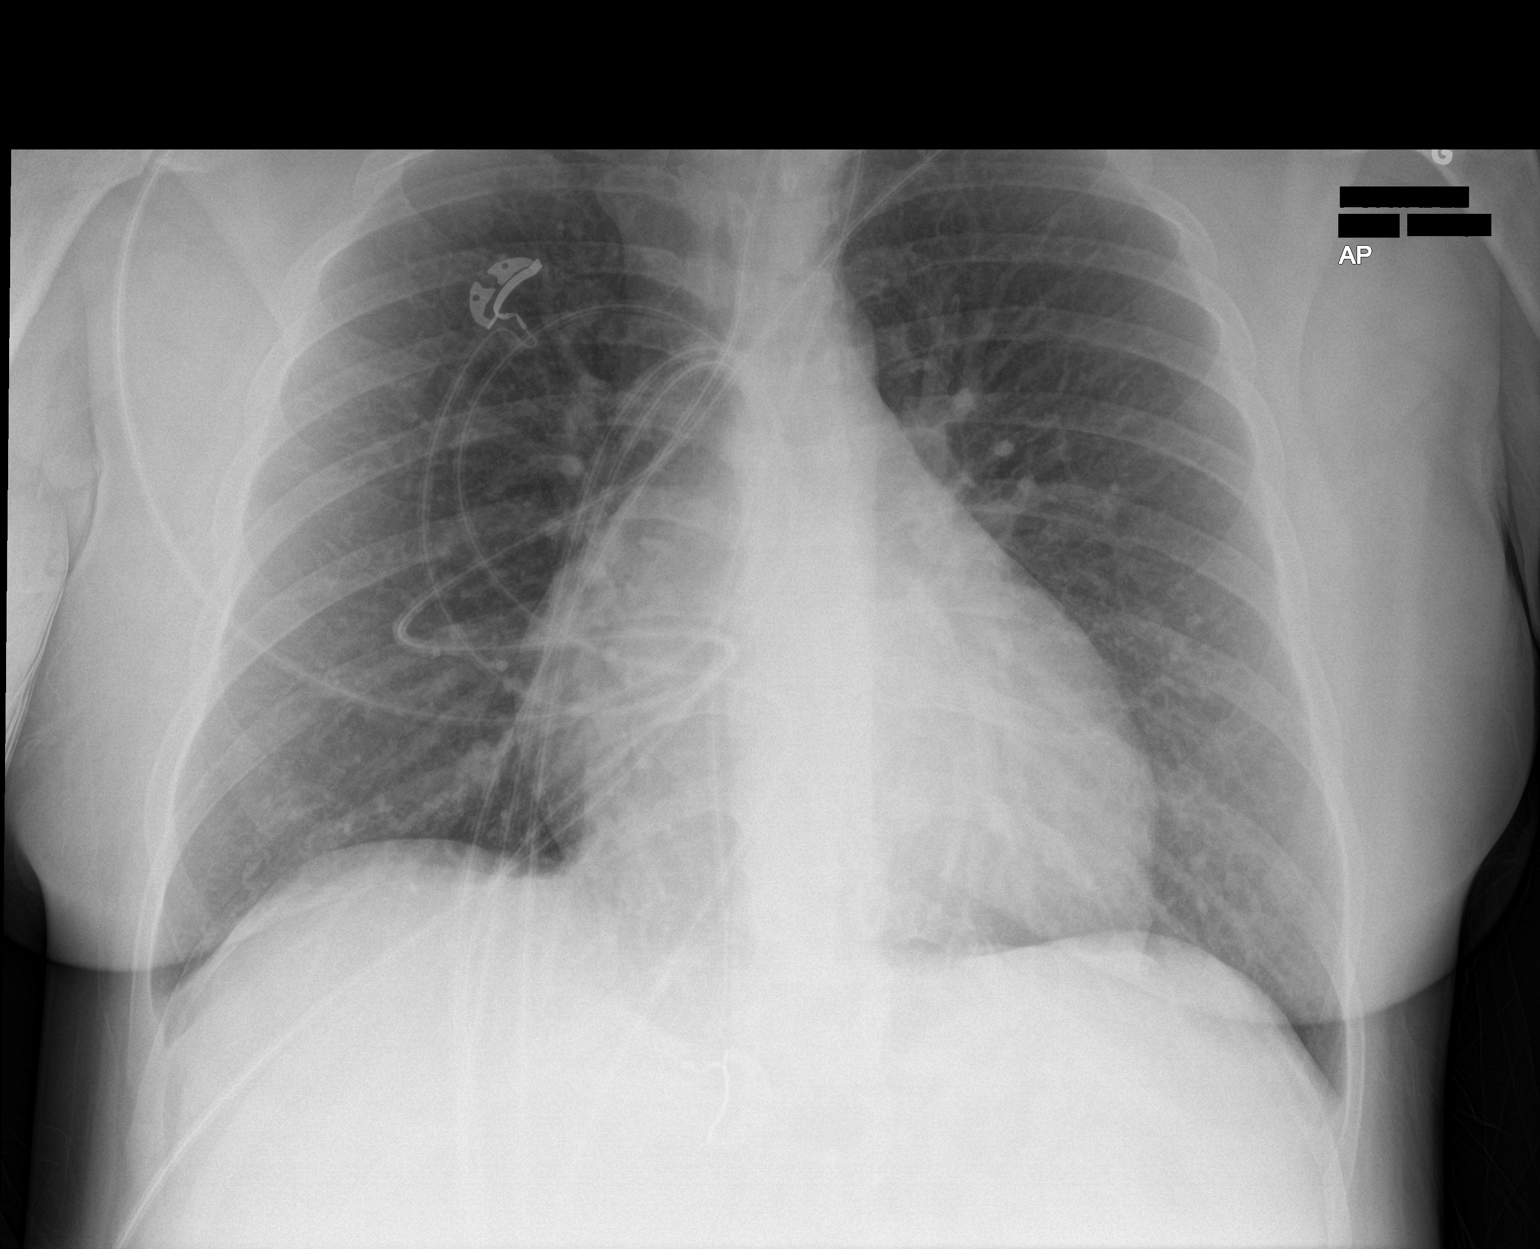

[abdomen erect]
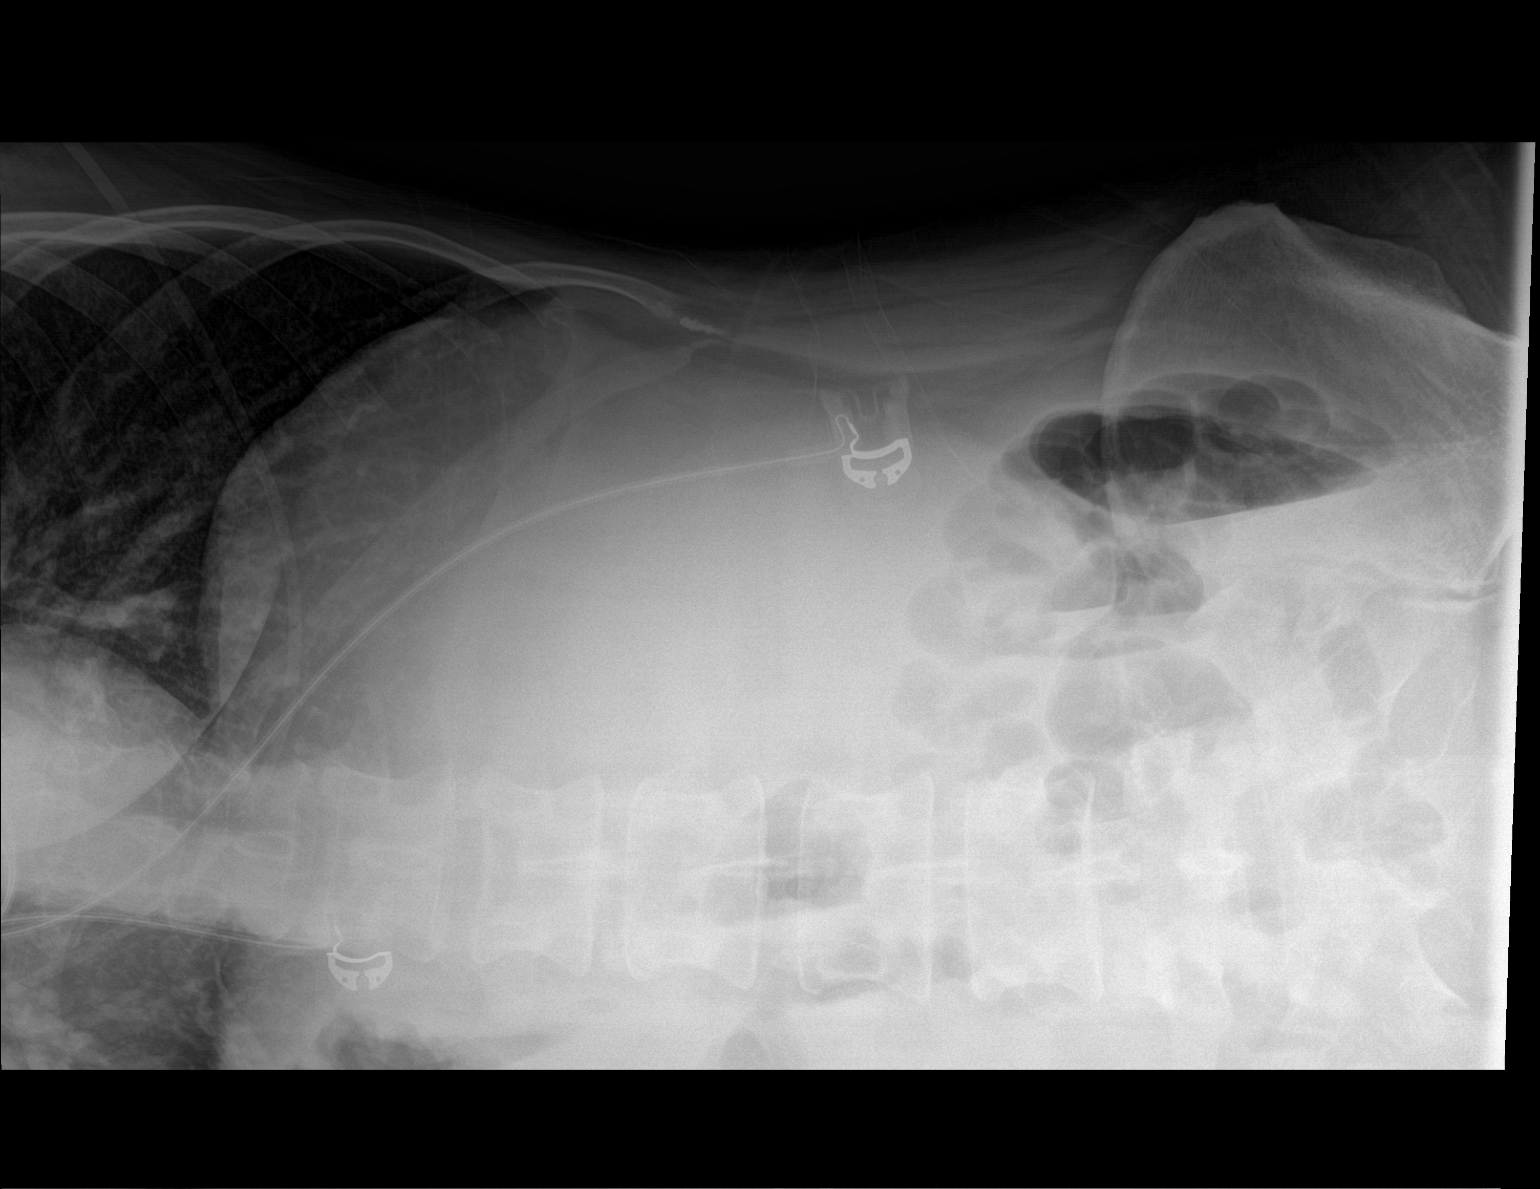

[abdomen supine]
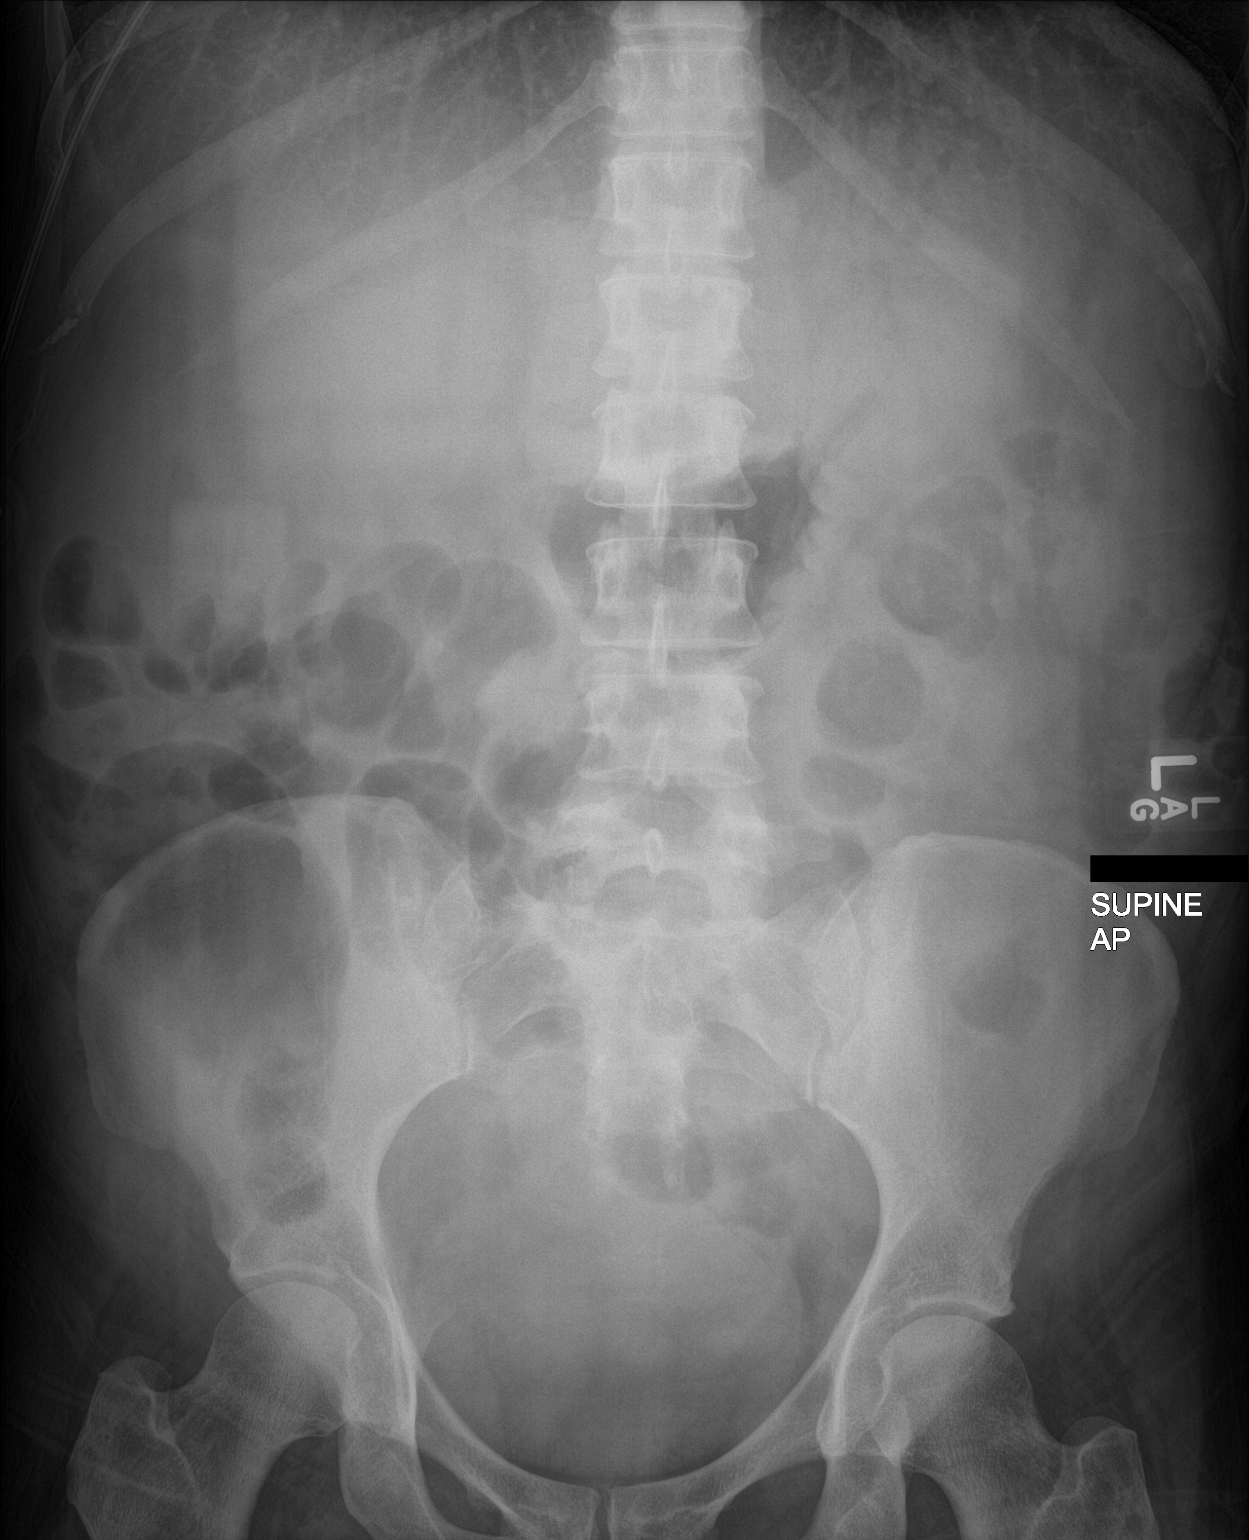

[3 of 3 positions shown; findings below may reference images not displayed]

FINDINGS: Cardiac silhouette is mildly enlarged, mediastinal silhouette is
nonsuspicious. Pulmonary vascular congestion without pleural
effusion or focal consolidation. No pneumothorax. Soft tissue planes
included osseous structures are nonsuspicious.

Bowel gas pattern is nondilated and nonobstructive. No
intraperitoneal free air. No intra-abdominal mass effect or
pathologic calcifications. Soft tissue planes and included osseous
structures are normal.
IMPRESSION: Mild cardiomegaly and pulmonary vascular congestion.

Normal bowel gas pattern.

## 2018-05-22 IMAGING — DX DG CHEST 1V PORT
1 series · 1 of 1 positions shown · non-contrast
Comparison: 12/10/2016

CLINICAL DATA: ET tube placement

EXAM:
PORTABLE CHEST 1 VIEW

[chest]
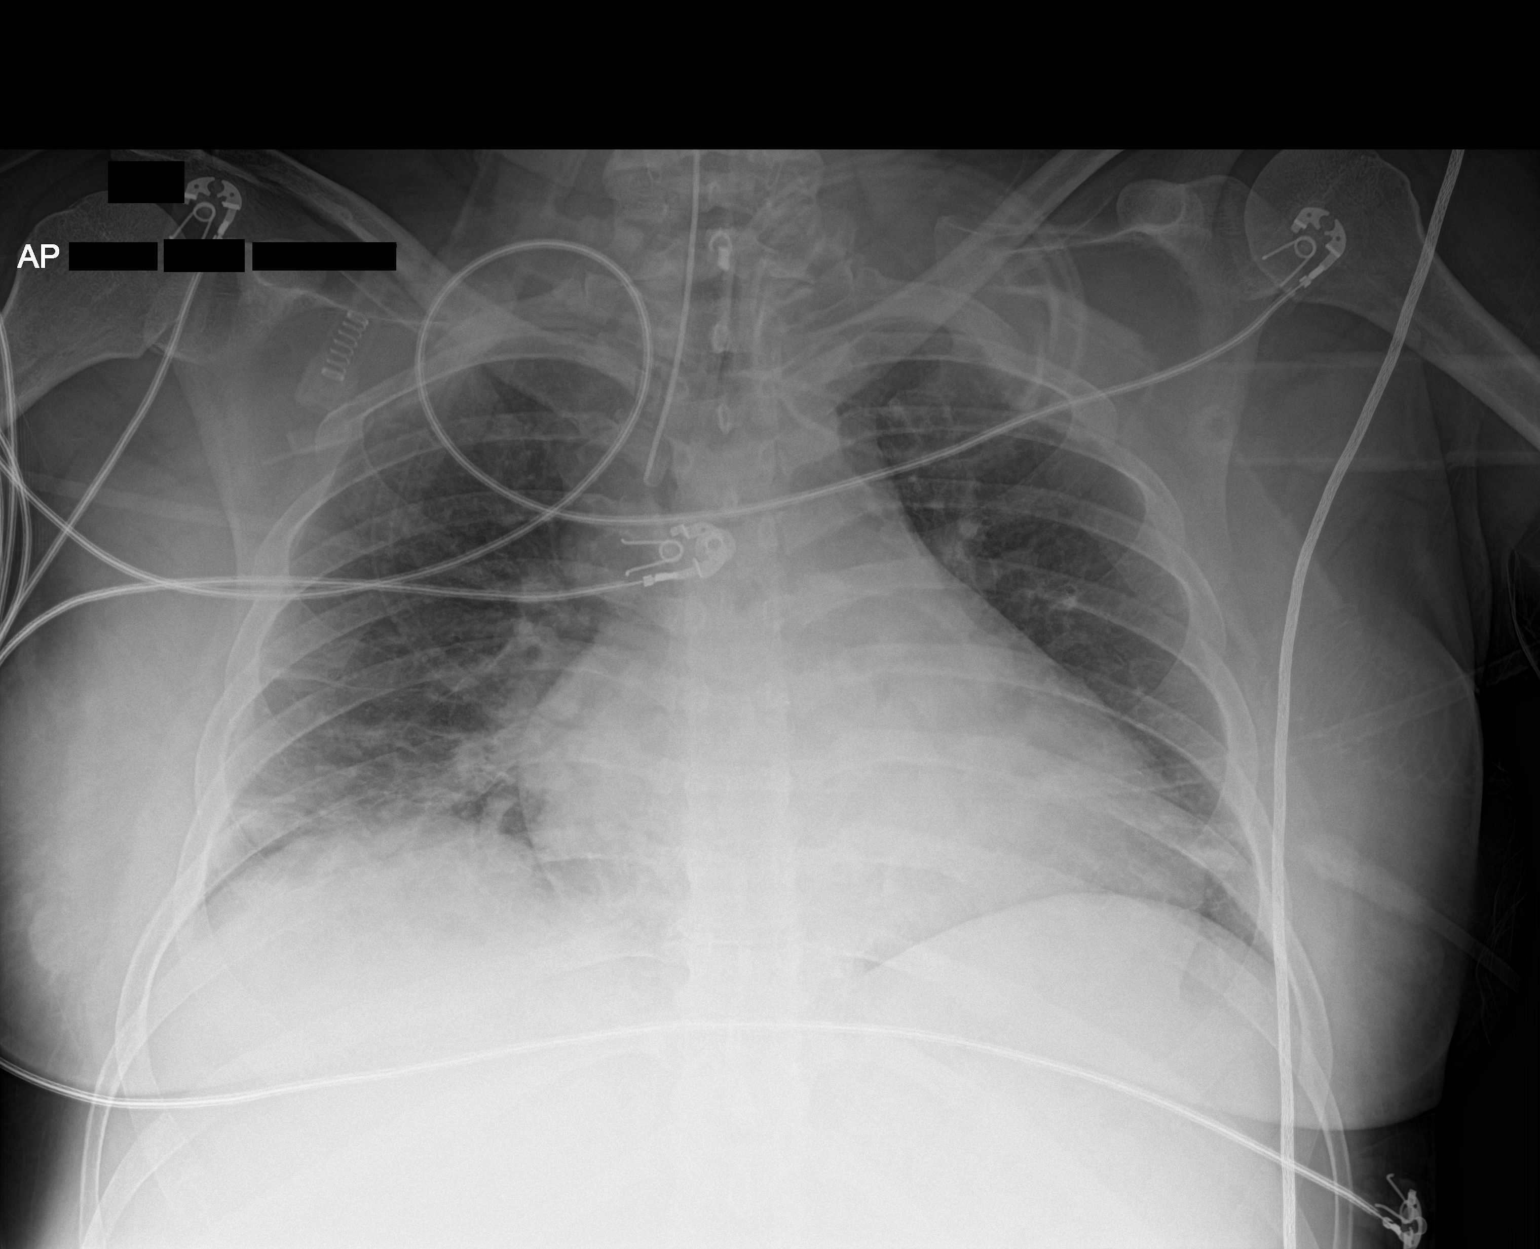

[1 of 1 positions shown; findings below may reference images not displayed]

FINDINGS: Endotracheal tube tip is approximately 2.1 cm superior to the
carina. Low lung volumes. Increased opacity at the right lung base.
No pleural effusion. Mild cardiomegaly.
IMPRESSION: 1. Endotracheal tube tip about 2.1 cm superior to carina
2. Low lung volumes with right basilar atelectasis or infiltrate
3. Cardiomegaly
